# Patient Record
Sex: Male | Born: 1960 | Race: White | Hispanic: No | Marital: Single | Smoking: Current every day smoker
Health system: Southern US, Community
[De-identification: ages and names within clinical notes are randomized; demographics above are authoritative.]

## PROBLEM LIST (undated history)

## (undated) DIAGNOSIS — T79A12A Traumatic compartment syndrome of left upper extremity, initial encounter: Secondary | ICD-10-CM

## (undated) DIAGNOSIS — T79A22A Traumatic compartment syndrome of left lower extremity, initial encounter: Secondary | ICD-10-CM

## (undated) DIAGNOSIS — I729 Aneurysm of unspecified site: Secondary | ICD-10-CM

## (undated) DIAGNOSIS — N289 Disorder of kidney and ureter, unspecified: Secondary | ICD-10-CM

## (undated) HISTORY — PX: CEREBRAL ANEURYSM REPAIR: SHX164

## (undated) HISTORY — PX: OTHER SURGICAL HISTORY: SHX169

---

## 1998-04-23 ENCOUNTER — Encounter: Payer: Self-pay | Admitting: Emergency Medicine

## 1998-04-23 ENCOUNTER — Observation Stay (HOSPITAL_COMMUNITY): Admission: EM | Admit: 1998-04-23 | Discharge: 1998-04-24 | Payer: Self-pay | Admitting: Emergency Medicine

## 2004-07-08 ENCOUNTER — Ambulatory Visit: Payer: Self-pay

## 2004-08-13 ENCOUNTER — Ambulatory Visit: Payer: Self-pay

## 2006-05-14 ENCOUNTER — Ambulatory Visit: Payer: Self-pay | Admitting: Specialist

## 2006-08-31 ENCOUNTER — Ambulatory Visit: Payer: Self-pay | Admitting: Specialist

## 2007-09-29 ENCOUNTER — Inpatient Hospital Stay: Payer: Self-pay | Admitting: Psychiatry

## 2008-06-13 ENCOUNTER — Inpatient Hospital Stay: Payer: Self-pay | Admitting: Psychiatry

## 2008-07-27 ENCOUNTER — Emergency Department: Payer: Self-pay | Admitting: Emergency Medicine

## 2009-12-11 ENCOUNTER — Emergency Department: Payer: Self-pay | Admitting: Emergency Medicine

## 2010-01-15 ENCOUNTER — Inpatient Hospital Stay: Payer: Self-pay | Admitting: Psychiatry

## 2011-03-21 ENCOUNTER — Emergency Department: Payer: Self-pay | Admitting: Emergency Medicine

## 2011-08-03 ENCOUNTER — Inpatient Hospital Stay: Payer: Self-pay | Admitting: Internal Medicine

## 2011-08-03 DIAGNOSIS — R748 Abnormal levels of other serum enzymes: Secondary | ICD-10-CM

## 2011-08-03 DIAGNOSIS — Z79899 Other long term (current) drug therapy: Secondary | ICD-10-CM

## 2011-08-03 LAB — CBC
HCT: 53 % — ABNORMAL HIGH (ref 40.0–52.0)
HGB: 17.6 g/dL (ref 13.0–18.0)
MCH: 31.7 pg (ref 26.0–34.0)
MCHC: 33.2 g/dL (ref 32.0–36.0)
MCV: 95 fL (ref 80–100)
Platelet: 149 10*3/uL — ABNORMAL LOW (ref 150–440)
RBC: 5.55 10*6/uL (ref 4.40–5.90)
RDW: 13.7 % (ref 11.5–14.5)
WBC: 9.5 10*3/uL (ref 3.8–10.6)

## 2011-08-03 LAB — DRUG SCREEN, URINE
Amphetamines, Ur Screen: NEGATIVE (ref ?–1000)
Barbiturates, Ur Screen: NEGATIVE (ref ?–200)
Cannabinoid 50 Ng, Ur ~~LOC~~: NEGATIVE (ref ?–50)
Cocaine Metabolite,Ur ~~LOC~~: POSITIVE (ref ?–300)
MDMA (Ecstasy)Ur Screen: NEGATIVE (ref ?–500)
Methadone, Ur Screen: POSITIVE (ref ?–300)
Opiate, Ur Screen: POSITIVE (ref ?–300)
Phencyclidine (PCP) Ur S: NEGATIVE (ref ?–25)
Tricyclic, Ur Screen: NEGATIVE (ref ?–1000)

## 2011-08-03 LAB — COMPREHENSIVE METABOLIC PANEL
Albumin: 4.2 g/dL (ref 3.4–5.0)
Alkaline Phosphatase: 63 U/L (ref 50–136)
Anion Gap: 10 (ref 7–16)
BUN: 22 mg/dL — ABNORMAL HIGH (ref 7–18)
Bilirubin,Total: 2.5 mg/dL — ABNORMAL HIGH (ref 0.2–1.0)
Calcium, Total: 8.9 mg/dL (ref 8.5–10.1)
Chloride: 104 mmol/L (ref 98–107)
Co2: 27 mmol/L (ref 21–32)
Creatinine: 1.22 mg/dL (ref 0.60–1.30)
EGFR (African American): >60
EGFR (Non-African Amer.): >60
Glucose: 78 mg/dL (ref 65–99)
Osmolality: 283 (ref 275–301)
Potassium: 4.1 mmol/L (ref 3.5–5.1)
SGOT(AST): 410 U/L — ABNORMAL HIGH (ref 15–37)
SGPT (ALT): 240 U/L — ABNORMAL HIGH
Sodium: 141 mmol/L (ref 136–145)
Total Protein: 7.5 g/dL (ref 6.4–8.2)

## 2011-08-03 LAB — CK TOTAL AND CKMB (NOT AT ARMC)
CK, Total: 10349 U/L — ABNORMAL HIGH (ref 35–232)
CK, Total: 6358 U/L — ABNORMAL HIGH (ref 35–232)
CK-MB: 40.8 ng/mL — ABNORMAL HIGH (ref 0.5–3.6)

## 2011-08-03 LAB — ETHANOL
Ethanol %: <0.003 % (ref 0.000–0.080)
Ethanol: <3 mg/dL

## 2011-08-03 LAB — BILIRUBIN, DIRECT: Bilirubin, Direct: 0.6 mg/dL — ABNORMAL HIGH (ref 0.00–0.20)

## 2011-08-03 LAB — CK: CK, Total: 15319 U/L — ABNORMAL HIGH (ref 35–232)

## 2011-08-03 LAB — SALICYLATE LEVEL: Salicylates, Serum: <1.7 mg/dL

## 2011-08-03 LAB — TROPONIN I
Troponin-I: 0.07 ng/mL — ABNORMAL HIGH
Troponin-I: 0.06 ng/mL — ABNORMAL HIGH

## 2011-08-03 LAB — AMMONIA: Ammonia, Plasma: 48 mcmol/L — ABNORMAL HIGH (ref 11–32)

## 2011-08-03 LAB — ACETAMINOPHEN LEVEL: Acetaminophen: <2 ug/mL

## 2011-08-03 LAB — FOLATE: Folic Acid: 18.1 ng/mL (ref 3.1–100.0)

## 2011-08-04 DIAGNOSIS — R55 Syncope and collapse: Secondary | ICD-10-CM

## 2011-08-04 LAB — CBC WITH DIFFERENTIAL/PLATELET
Basophil %: 0.9 %
Eosinophil #: 0.4 10*3/uL (ref 0.0–0.7)
Eosinophil %: 6.2 %
HCT: 47.8 % (ref 40.0–52.0)
HGB: 15.8 g/dL (ref 13.0–18.0)
Lymphocyte #: 1 10*3/uL (ref 1.0–3.6)
Lymphocyte %: 16.5 %
MCH: 31.5 pg (ref 26.0–34.0)
Monocyte #: 0.7 x10 3/mm (ref 0.2–1.0)
Monocyte %: 10.6 %
Neutrophil #: 4.2 10*3/uL (ref 1.4–6.5)
Neutrophil %: 65.8 %
Platelet: 128 10*3/uL — ABNORMAL LOW (ref 150–440)
RBC: 5 10*6/uL (ref 4.40–5.90)
RDW: 13.9 % (ref 11.5–14.5)
WBC: 6.3 10*3/uL (ref 3.8–10.6)

## 2011-08-04 LAB — COMPREHENSIVE METABOLIC PANEL
Albumin: 2.8 g/dL — ABNORMAL LOW (ref 3.4–5.0)
Alkaline Phosphatase: 50 U/L (ref 50–136)
Anion Gap: 7 (ref 7–16)
BUN: 14 mg/dL (ref 7–18)
Calcium, Total: 7.4 mg/dL — ABNORMAL LOW (ref 8.5–10.1)
Chloride: 110 mmol/L — ABNORMAL HIGH (ref 98–107)
Creatinine: 0.89 mg/dL (ref 0.60–1.30)
EGFR (Non-African Amer.): >60
Osmolality: 286 (ref 275–301)
Potassium: 4.1 mmol/L (ref 3.5–5.1)
SGOT(AST): 196 U/L — ABNORMAL HIGH (ref 15–37)
SGPT (ALT): 184 U/L — ABNORMAL HIGH
Sodium: 143 mmol/L (ref 136–145)
Total Protein: 5.6 g/dL — ABNORMAL LOW (ref 6.4–8.2)

## 2011-08-04 LAB — ACETAMINOPHEN LEVEL: Acetaminophen: <2 ug/mL

## 2012-04-02 ENCOUNTER — Emergency Department: Payer: Self-pay | Admitting: Emergency Medicine

## 2012-04-02 LAB — MONONUCLEOSIS SCREEN: Mono Test: NEGATIVE

## 2013-03-29 ENCOUNTER — Ambulatory Visit: Payer: Self-pay | Admitting: Internal Medicine

## 2013-05-08 ENCOUNTER — Ambulatory Visit: Payer: Self-pay | Admitting: Internal Medicine

## 2013-12-08 ENCOUNTER — Emergency Department: Payer: Self-pay | Admitting: Emergency Medicine

## 2013-12-08 LAB — CBC
HCT: 45 % (ref 40.0–52.0)
HGB: 13.9 g/dL (ref 13.0–18.0)
MCH: 24.8 pg — ABNORMAL LOW (ref 26.0–34.0)
MCHC: 30.8 g/dL — ABNORMAL LOW (ref 32.0–36.0)
MCV: 81 fL (ref 80–100)
Platelet: 275 10*3/uL (ref 150–440)
RBC: 5.58 10*6/uL (ref 4.40–5.90)
RDW: 20.3 % — AB (ref 11.5–14.5)
WBC: 7.7 10*3/uL (ref 3.8–10.6)

## 2013-12-08 LAB — COMPREHENSIVE METABOLIC PANEL
ALBUMIN: 3.7 g/dL (ref 3.4–5.0)
ALK PHOS: 50 U/L
AST: 120 U/L — AB (ref 15–37)
Anion Gap: 8 (ref 7–16)
BUN: 15 mg/dL (ref 7–18)
Bilirubin,Total: 0.6 mg/dL (ref 0.2–1.0)
Calcium, Total: 8.5 mg/dL (ref 8.5–10.1)
Chloride: 105 mmol/L (ref 98–107)
Co2: 28 mmol/L (ref 21–32)
Creatinine: 1.28 mg/dL (ref 0.60–1.30)
EGFR (African American): >60
EGFR (Non-African Amer.): >60
Glucose: 90 mg/dL (ref 65–99)
OSMOLALITY: 282 (ref 275–301)
Potassium: 3.9 mmol/L (ref 3.5–5.1)
SGPT (ALT): 93 U/L — ABNORMAL HIGH
Sodium: 141 mmol/L (ref 136–145)
TOTAL PROTEIN: 7.2 g/dL (ref 6.4–8.2)

## 2013-12-08 LAB — SALICYLATE LEVEL: Salicylates, Serum: 2.2 mg/dL

## 2013-12-08 LAB — ACETAMINOPHEN LEVEL

## 2013-12-08 LAB — ETHANOL: Ethanol: <3 mg/dL

## 2013-12-16 ENCOUNTER — Emergency Department: Payer: Self-pay | Admitting: Student

## 2013-12-16 LAB — COMPREHENSIVE METABOLIC PANEL
ALBUMIN: 4.2 g/dL (ref 3.4–5.0)
ALK PHOS: 62 U/L
Anion Gap: 7 (ref 7–16)
BUN: 17 mg/dL (ref 7–18)
Bilirubin,Total: 0.6 mg/dL (ref 0.2–1.0)
CALCIUM: 8.8 mg/dL (ref 8.5–10.1)
CHLORIDE: 102 mmol/L (ref 98–107)
CO2: 27 mmol/L (ref 21–32)
Creatinine: 1.53 mg/dL — ABNORMAL HIGH (ref 0.60–1.30)
EGFR (Non-African Amer.): 51 — ABNORMAL LOW
GFR CALC AF AMER: 59 — AB
GLUCOSE: 114 mg/dL — AB (ref 65–99)
Osmolality: 274 (ref 275–301)
POTASSIUM: 3.6 mmol/L (ref 3.5–5.1)
SGOT(AST): 102 U/L — ABNORMAL HIGH (ref 15–37)
SGPT (ALT): 125 U/L — ABNORMAL HIGH
Sodium: 136 mmol/L (ref 136–145)
Total Protein: 7.6 g/dL (ref 6.4–8.2)

## 2013-12-16 LAB — CBC
HCT: 48.2 % (ref 40.0–52.0)
HGB: 15.2 g/dL (ref 13.0–18.0)
MCH: 25.3 pg — ABNORMAL LOW (ref 26.0–34.0)
MCHC: 31.5 g/dL — ABNORMAL LOW (ref 32.0–36.0)
MCV: 80 fL (ref 80–100)
Platelet: 283 10*3/uL (ref 150–440)
RBC: 6 10*6/uL — ABNORMAL HIGH (ref 4.40–5.90)
RDW: 20.8 % — ABNORMAL HIGH (ref 11.5–14.5)
WBC: 8 10*3/uL (ref 3.8–10.6)

## 2013-12-16 LAB — SALICYLATE LEVEL: Salicylates, Serum: 3.6 mg/dL — ABNORMAL HIGH

## 2013-12-16 LAB — ETHANOL: Ethanol: <3 mg/dL

## 2013-12-16 LAB — ACETAMINOPHEN LEVEL

## 2013-12-17 LAB — URINALYSIS, COMPLETE
Bacteria: NONE SEEN
Bilirubin,UR: NEGATIVE
Blood: NEGATIVE
Glucose,UR: NEGATIVE mg/dL (ref 0–75)
LEUKOCYTE ESTERASE: NEGATIVE
Nitrite: NEGATIVE
Ph: 6 (ref 4.5–8.0)
Protein: NEGATIVE
SPECIFIC GRAVITY: 1.02 (ref 1.003–1.030)
Squamous Epithelial: NONE SEEN
WBC UR: 2 /HPF (ref 0–5)

## 2013-12-17 LAB — DRUG SCREEN, URINE
Amphetamines, Ur Screen: NEGATIVE (ref ?–1000)
BARBITURATES, UR SCREEN: NEGATIVE (ref ?–200)
BENZODIAZEPINE, UR SCRN: POSITIVE (ref ?–200)
CANNABINOID 50 NG, UR ~~LOC~~: NEGATIVE (ref ?–50)
COCAINE METABOLITE, UR ~~LOC~~: POSITIVE (ref ?–300)
MDMA (Ecstasy)Ur Screen: NEGATIVE (ref ?–500)
Methadone, Ur Screen: POSITIVE (ref ?–300)
Opiate, Ur Screen: NEGATIVE (ref ?–300)
Phencyclidine (PCP) Ur S: NEGATIVE (ref ?–25)
Tricyclic, Ur Screen: NEGATIVE (ref ?–1000)

## 2014-03-27 ENCOUNTER — Emergency Department: Payer: Self-pay | Admitting: Emergency Medicine

## 2014-03-27 LAB — CBC
HCT: 47 % (ref 40.0–52.0)
HGB: 14.7 g/dL (ref 13.0–18.0)
MCH: 27.2 pg (ref 26.0–34.0)
MCHC: 31.3 g/dL — ABNORMAL LOW (ref 32.0–36.0)
MCV: 87 fL (ref 80–100)
Platelet: 258 10*3/uL (ref 150–440)
RBC: 5.43 10*6/uL (ref 4.40–5.90)
RDW: 18.6 % — ABNORMAL HIGH (ref 11.5–14.5)
WBC: 16.8 10*3/uL — ABNORMAL HIGH (ref 3.8–10.6)

## 2014-03-27 LAB — URINALYSIS, COMPLETE
BILIRUBIN, UR: NEGATIVE
Bacteria: NONE SEEN
Glucose,UR: >=500 mg/dL (ref 0–75)
Hyaline Cast: 2
Leukocyte Esterase: NEGATIVE
Nitrite: NEGATIVE
PH: 5 (ref 4.5–8.0)
SPECIFIC GRAVITY: 1.023 (ref 1.003–1.030)
Squamous Epithelial: NONE SEEN

## 2014-03-27 LAB — ETHANOL

## 2014-03-27 LAB — ACETAMINOPHEN LEVEL: Acetaminophen: <2 ug/mL

## 2014-03-27 LAB — COMPREHENSIVE METABOLIC PANEL
ALBUMIN: 3.5 g/dL (ref 3.4–5.0)
Alkaline Phosphatase: 63 U/L
Anion Gap: 9 (ref 7–16)
BUN: 24 mg/dL — ABNORMAL HIGH (ref 7–18)
Bilirubin,Total: 1.5 mg/dL — ABNORMAL HIGH (ref 0.2–1.0)
CHLORIDE: 102 mmol/L (ref 98–107)
CO2: 27 mmol/L (ref 21–32)
Calcium, Total: 8.9 mg/dL (ref 8.5–10.1)
Creatinine: 1.11 mg/dL (ref 0.60–1.30)
EGFR (Non-African Amer.): >60
GLUCOSE: 127 mg/dL — AB (ref 65–99)
Osmolality: 281 (ref 275–301)
Potassium: 4.1 mmol/L (ref 3.5–5.1)
SGOT(AST): 745 U/L — ABNORMAL HIGH (ref 15–37)
SGPT (ALT): 323 U/L — ABNORMAL HIGH
Sodium: 138 mmol/L (ref 136–145)
TOTAL PROTEIN: 7.2 g/dL (ref 6.4–8.2)

## 2014-03-27 LAB — SALICYLATE LEVEL

## 2014-03-27 LAB — DRUG SCREEN, URINE
AMPHETAMINES, UR SCREEN: NEGATIVE (ref ?–1000)
Barbiturates, Ur Screen: NEGATIVE (ref ?–200)
Benzodiazepine, Ur Scrn: POSITIVE (ref ?–200)
COCAINE METABOLITE, UR ~~LOC~~: POSITIVE (ref ?–300)
Cannabinoid 50 Ng, Ur ~~LOC~~: NEGATIVE (ref ?–50)
MDMA (Ecstasy)Ur Screen: POSITIVE (ref ?–500)
Methadone, Ur Screen: NEGATIVE (ref ?–300)
Opiate, Ur Screen: NEGATIVE (ref ?–300)
PHENCYCLIDINE (PCP) UR S: NEGATIVE (ref ?–25)
Tricyclic, Ur Screen: NEGATIVE (ref ?–1000)

## 2014-03-28 ENCOUNTER — Emergency Department: Payer: Self-pay | Admitting: Emergency Medicine

## 2014-03-28 LAB — CBC
HCT: 46.8 % (ref 40.0–52.0)
HGB: 15 g/dL (ref 13.0–18.0)
MCH: 27.7 pg (ref 26.0–34.0)
MCHC: 32 g/dL (ref 32.0–36.0)
MCV: 87 fL (ref 80–100)
Platelet: 251 10*3/uL (ref 150–440)
RBC: 5.41 10*6/uL (ref 4.40–5.90)
RDW: 18.8 % — AB (ref 11.5–14.5)
WBC: 12.3 10*3/uL — ABNORMAL HIGH (ref 3.8–10.6)

## 2014-03-28 LAB — BASIC METABOLIC PANEL
Anion Gap: 8 (ref 7–16)
BUN: 16 mg/dL (ref 7–18)
CALCIUM: 8.2 mg/dL — AB (ref 8.5–10.1)
CHLORIDE: 104 mmol/L (ref 98–107)
CO2: 30 mmol/L (ref 21–32)
Creatinine: 0.94 mg/dL (ref 0.60–1.30)
EGFR (African American): >60
EGFR (Non-African Amer.): >60
Glucose: 102 mg/dL — ABNORMAL HIGH (ref 65–99)
Osmolality: 285 (ref 275–301)
Potassium: 3.6 mmol/L (ref 3.5–5.1)
SODIUM: 142 mmol/L (ref 136–145)

## 2014-03-28 LAB — TROPONIN I: Troponin-I: <0.02 ng/mL

## 2014-08-03 NOTE — Consult Note (Signed)
PATIENT NAME:  Benjamin Merritt, Benjamin Merritt MR#:  [id number] DATE OF BIRTH:  [birth year]  DATE OF CONSULTATION:  12/17/2013  REFERRING PHYSICIAN:   CONSULTING PHYSICIAN:  Audery AmelJohn T. Damari Suastegui, MD  HISTORY OF PRESENT ILLNESS: A 54 year old man presented last night intoxicated on cocaine, was committed for making suicidal statements in front of his wife. See full intake. Today, he has slept during the night, says he has calmed down and his mood is feeling better. Denies any suicidal ideation. The patient appears to currently be lucid and is requesting discharge from the Emergency Room and says he will follow up with cocaine dependence treatment in the community.   REVIEW OF SYSTEMS: Denies any acute pain, nausea, shortness of breath, or other physical symptoms. Denies suicidal or homicidal ideation. Denies hallucinations.  Full review of systems negative.   MENTAL STATUS EXAMINATION: Neatly groomed man, looks his stated age, cooperative with the interview. Eye contact normal. Psychomotor activity normal. Speech normal. Affect still somewhat blunted. Mood stated as being all right. Thoughts are lucid. No loosening of associations or delusions. Denies auditory or visual hallucinations. Denies suicidal or homicidal ideation. Shows adequate insight and judgment. Normal intelligence. Alert and oriented x4.  Short and long-term memory intact.   ASSESSMENT:  A 54 year old man with cocaine dependence and stimulant-induced mood disorder, now resolved. No evidence of acute suicidality. Not likely to benefit from inpatient hospital treatment. Not medically unstable.  No longer meets commitment criteria.   TREATMENT PLAN: Discussed with Dr. Dondra SpryGail the Emergency Room physician. The patient off of IVC as he no longer meets criteria. The patient strongly counseled about the dangers of cocaine use. He agrees that he will follow up with his outpatient substance abuse treatment the [facility] in [place]. The patient can be released  at this point.   DIAGNOSIS, PRINCIPAL AND PRIMARY:  AXIS I: Substance-induced mood disorder, manic-like symptoms from stimulants.   SECONDARY DIAGNOSIS:  AXIS I: Cocaine abuse opiate abuse on maintenance medication.     ____________________________ Audery AmelJohn T. Maddalynn Barnard, MD jtc:by D: 12/17/2013 13:48:52 ET T: 12/17/2013 15:40:40 ET JOB#: [id number]  cc: Audery AmelJohn T. Dilia Alemany, MD, <Dictator> Audery AmelJOHN T Carmie Lanpher MD ELECTRONICALLY SIGNED 12/21/2013 17:01

## 2014-08-03 NOTE — Consult Note (Signed)
Brief Consult Note: Diagnosis: cocaine abuse.   Patient was seen by consultant.   Consult note dictated.   Orders entered.   Comments: Psychiatry: PAtient seen and chart reviewed. Long hx of drug abuse. Currently irritable. Denies SI. Does not need inpatient treatment but would prob be safer after resting a few hours longer. Reevaluate in am.  Electronic Signatures: Audery Amellapacs, Ahmia Colford T (MD)  (Signed 06-Sep-15 17:19)  Authored: Brief Consult Note   Last Updated: 06-Sep-15 17:19 by Audery Amellapacs, Michele Kerlin T (MD)

## 2014-08-03 NOTE — Consult Note (Signed)
Brief Consult Note: Diagnosis: cocaine abuse.   Patient was seen by consultant.   Consult note dictated.   Discussed with Attending MD.   Comments: Psychiatry: PAtient currently calm and denies any suicidal or homicidal ideation. He is lucid. Agrees he needs to stop cocaine. No longer commitable. RElease from IVC. Follow up [facility] program in [place].  Electronic Signatures: Toni Amendlapacs, Jackquline DenmarkJohn T (MD)  (Signed 07-Sep-15 13:45)  Authored: Brief Consult Note   Last Updated: 07-Sep-15 13:45 by Audery Amellapacs, John T (MD)

## 2014-08-03 NOTE — Consult Note (Signed)
PATIENT NAME:  Benjamin Merritt, Benjamin Merritt MR#:  [id number] DATE OF BIRTH:  Nov 03, 1960  DATE OF CONSULTATION:  12/16/2013  CONSULTING PHYSICIAN:  Benjamin Amel, MD  IDENTIFYING INFORMATION AND REASON FOR CONSULTATION: A 54 year old man with an established substance dependence problem was brought in under involuntary commitment filled out by his wife.   CHIEF COMPLAINT: "She thinks she knows everything, but she don't."   HISTORY OF PRESENT ILLNESS: The Commitment Petition states that he has been abusing cocaine heavily and has been getting more angry and frustrated and that he made statements about how he would be better off dead, which appeared to be possibly indicating suicidal ideation. On interview today, the patient admits that he made a statement like that, but he denies that he had any suicidal intent. He says he was angry at his wife because of how she nags him constantly about his drug addiction problem. He admits that he has been abusing crack cocaine for several days now. He denies that he has been abusing other drugs, only that he has been taking his usually prescribed narcotics. He states that his mood has been bad and angry. Sleep is poor. Appetite poor. Denies having any actual suicidal intent. No homicidal ideation. Denies that he is having any hallucinations. The patient states very clearly that he thinks that his wife is over stepping her bounds and that he thinks she has no right to insist that he stop abusing drugs.   PAST PSYCHIATRIC HISTORY: The patient has a long history of drug abuse. He has been pulled over by police, been to rehab programs. Never has it seemed like it has stuck for a really long period of time. Today, he is clearly minimizing his use to me, saying that he only uses for about 4 days every few months or so. He was here in the emergency room just a couple of months ago with a similar episode. In any case, the patient does not have a history of suicide attempts. Says he  has never been really treated for any kind of mental health problem, except the substance abuse.   FAMILY HISTORY: The patient will not discuss this.   SOCIAL HISTORY: Lives with his wife. No children. The patient says he is not currently working because he has an inheritance that he lives on. He seems to be a little bit evasive about this.   PAST MEDICAL HISTORY: He claims he has no significant medical problems other than chronic pain. He takes standing doses of oxycodone, prescribed by his primary care doctor. He says that it from a shoulder injury he suffered while he was in the [employer]. He did have an episode in 2013 of rhabdomyolysis related to his substance abuse.   CURRENT MEDICATIONS: Oxycodone 30 mg 4 times a day, also takes testosterone supplements prescribed by his primary care doctor.   ALLERGIES: No known drug allergies.   REVIEW OF SYSTEMS: Feels angry and irritable. Chronic pain. Feels like he needs to have a cigarette. Otherwise, no specific symptoms. The rest of the full review of systems is negative.   MENTAL STATUS EXAMINATION: Neatly groomed man, looks his stated age. He is cooperative with the interview, although rather angry and irritable about it. Eye contact poor. Psychomotor activity fidgety. Speech rapid and loud at times, but not necessarily pressured. Thoughts at times intensely focused on his anger at his wife, but does not make obviously delusional statements. Denies auditory or visual hallucinations. Denies suicidal or homicidal ideation. He is alert  and oriented x 4. Normal intelligence. Short and long-term memory intact. Judgment and insight questionable, especially about his substance abuse.   LABORATORY RESULTS: His chemistry panel shows an elevated glucose at 114, creatinine elevated at 1.53, ALT elevated at 125, AST elevated at 102. Alcohol level negative. CBC largely unremarkable. He has not given a urinalysis sample. We do not have a drug screen yet.   Benjamin Merritt  SIGNS: He has current blood pressure of 156/94, respirations 18, pulse 86, temperature 98.4.   ASSESSMENT: This is a 54 year old man with a history of substance abuse, brought in under an involuntary commitment. The patient is denying any suicidality. He does not have a history of suicide attempts. He does have a history of irresponsible substance abuse behavior. His current presentation suggests to me that he is still intoxicated on cocaine and possibly other drugs. I do not think that he needs hospitalization. He does not appear to be abusing alcohol or having any acute medical symptoms. On the other hand, I think he would be safer if allowed to rest up for a little while and detox a bit before being discharged. I recommend that he be fed and I have given him a nicotine patch and supplement. He can rest up overnight. I will re-evaluate in the morning.   DIAGNOSIS: Principal and primary:  AXIS I: Cocaine abuse.   SECONDARY DIAGNOSES: AXIS I: Substance-induced mood disorder, agitated and manic-like symptoms.  AXIS II: Deferred.  AXIS III: Chronic pain.    ____________________________ Benjamin AmelJohn T. Clapacs, MD jtc:TT D: 12/16/2013 17:30:55 ET T: 12/16/2013 18:05:11 ET JOB#: [id number]  cc: Benjamin AmelJohn T. Clapacs, MD, <Dictator> Benjamin AmelJOHN T CLAPACS MD ELECTRONICALLY SIGNED 12/21/2013 17:01

## 2014-08-03 NOTE — Consult Note (Signed)
PATIENT NAME:  Benjamin Merritt, Benjamin Merritt MR#:  [id number] DATE OF BIRTH:  [birth year]  DATE OF CONSULTATION:  03/28/2014  REFERRING PHYSICIAN:   CONSULTING PHYSICIAN:  Audery Amel, MD  IDENTIFYING INFORMATION AND REASON FOR CONSULTATION: This is a 54 year old man with a history of substance abuse who was petitioned to the hospital by his wife who is expressing concern about his substance use, the patient's chief concern  "they got me up here again."   HISTORY OF PRESENT ILLNESS: Information obtained from the patient and the chart. The patient admitted that he had been using crack cocaine over the last day. He was a little vague about it, but seemed to be claiming that prior to yesterday's use he had been sober for at least several days. He denies that he is using any illicit narcotics or drinking regularly. He says that his mood recently has been not so good. Sleep is not so good. He has a chronic pain issue which he says is still feeling out of control. He denies any suicidal ideation or homicidal ideation or hallucinations. He just got out of a long-term rehab program in [place] fairly recently and more or less immediately relapsed into drug use intermittently. Has not been following up with any local treatment. He has been taking his prescribed medication.   PAST PSYCHIATRIC HISTORY: The patient has a history of cocaine abuse as well as opiate abuse, chronic pain, and depression. There is no clear history of prior suicide attempts. He has been in several substance treatment programs over the last couple of years and continues to relapse. He is currently being treated with Wellbutrin, Vistaril, and Neurontin for his mood and anxiety. Says he has been compliant with those but ran out of his Neurontin recently. No history of psychotic symptoms.   FAMILY HISTORY: Denies any family history.   SOCIAL HISTORY: Separated from his wife. Currently he is staying in a motel and has paid up for a month. After  the beginning of year he is planning to get his own apartment. The patient recognizes that he has limited social support now. No transportation or way to get around.   PAST MEDICAL HISTORY: Chronic pain related to an injury he suffered in the [employer]. Uses a fentanyl patch.   CURRENT MEDICATIONS: Fentanyl patch 25 mcg, Neurontin 1200 mg 3 times a day, Vistaril 100 mg at night, Wellbutrin 100 mg in the morning.   ALLERGIES: No known drug allergies.   REVIEW OF SYSTEMS: Chronic pain in his right chest reaching around to his back. Mildly depressed mood. Denies suicidal ideation. Denies homicidal ideation. Denies any hallucinations. Not feeling jittery or having any other specific physical complaints.   MENTAL STATUS EXAMINATION: Disheveled gentleman with several recent scrapes and cuts on him which he cannot explain. Initially resistant to the interview and irritable, but calmed down and was able to participate appropriately. Eye contact only intermittent. Psychomotor activity fidgety. Speech is quite variable. On a couple of occasions he got angry and started shouting, but then would calm down again and speak normally, had a normal tone. Affect is labile and irritable. No sign of psychosis however, no threatening behavior, no evidence of delusions. Denies auditory or visual hallucinations. Denies suicidal or homicidal ideation. Can repeat 3 out of 3 words immediately, remembers 2 out of 3 at 3 minutes. He is alert and oriented x 4. Judgment and insight adequate enough, he understands the consequences of his continued behavior and he understands the availability of outpatient treatment.  LABORATORY RESULTS: Drug screen positive for MDMA which is probably the Wellbutrin and cocaine, also benzodiazepines. Chemistry, elevated glucose 127, elevated BUN at 24, ALT elevated at 323, AST elevated at 745. Bilirubin elevated at 1.5. CBC, elevated white count at 16.8.   VITAL SIGNS: Blood pressure 117/91,  respirations 20, pulse 85, temperature 98.6.   ASSESSMENT: A 54 year old man with cocaine abuse, severe. Currently denies suicidal or homicidal ideation. No evidence of suicidality or suicidal ideation last night. He is not psychotic. The patient expresses that he thinks he had enough of inpatient substance abuse treatment and he adamantly does not want to go back to it. Initially he is quite irritable about being brought in here, but eventually he was able to acknowledge that he did understand that his cocaine problem was out of hand. He agrees that he will consider going to outpatient treatment especially if he can see a therapist.   TREATMENT PLAN: He does not meet commitment criteria. IVC discontinued. Mr. Unk PintoHarvey Bryant, the representative of [facility]'s community support visited the patient this morning and left his phone number. I told the patient that Mr. Beverely PaceBryant was an extremely helpful person and could provide assistance if the patient was willing to participate in substance abuse treatment including practical assistance in getting him in to see a therapist. I strongly encouraged the patient to call Mr. Raj JanusBryant's phone number. He is encouraged to follow up with [facility] for outpatient treatment. He has his own primary care doctor who prescribes his medicine. Supportive counseling done.   DIAGNOSIS PRINCIPAL AND PRIMARY:   AXIS I: Cocaine abuse, severe.   SECONDARY DIAGNOSES:   AXIS I: Depression not otherwise specified.     AXIS II: Deferred.   AXIS III: Chronic pain.     ____________________________ Audery AmelJohn T. Clapacs, MD jtc:bu D: 03/28/2014 16:56:37 ET T: 03/28/2014 17:30:06 ET JOB#: [id number]  cc: Audery AmelJohn T. Clapacs, MD, <Dictator> Audery AmelJOHN T CLAPACS MD ELECTRONICALLY SIGNED 04/03/2014 0:39

## 2014-08-03 NOTE — Consult Note (Signed)
PATIENT NAME:  Benjamin Merritt, Benjamin Merritt MR#:  [id number] DATE OF BIRTH:  [birth year]  DATE OF CONSULTATION:  12/08/2013  REFERRING PHYSICIAN:   CONSULTING PHYSICIAN:  Kyndra Condron K. Koury Roddy, MD  SUBJECTIVE:  The patient was seen in consultation in room number 22-A, [facility].  The patient is a 54 year old white male who is not employed and is married for 7 years.  The patient was brought to the [facility] Emergency Room on IVC because he has been abusing cocaine on a regular basis and he and his wife had conflicts today stating that they got into arguments and fights and here he comes for help.    CHIEF COMPLAINT:  "I don't need to be here.  I've never been in any mental health institution and I will never go to a mental health institution."  PAST PSYCHIATRIC HISTORY: No previous history of inpatient on psychiatry.  No history of suicide attempt. Not being followed by any psychiatrist.   ALCOHOL AND DRUGS:  According to information obtained, the patient has been abusing cocaine and this has become more regular to the extent this has caused serious issues with his job and his marriage.  His wife got upset and she practically threw him out and the patient stated to the staff here that he has a girlfriend to go with which is rather questionable.  The patient reports he has an appointment at [facility] [place], [place], where they give help for cocaine and that he is going to go there as soon as he is discharged from the Emergency Room here and that is what he needs.    MENTAL STATUS EXAMINATION:  The patient is alert and oriented to place, person, and time, calm, pleasant, frustrated, upset about the conflicts with his wife.  Denies feeling depressed.  Denies feeling hopeless or helpless. No psychosis.  Does not appear to be responding to internal stimuli.  Contracts for safety and is eager to go the [facility] [place], [place].  Insight and judgment fair.  Impulse control is guarded.     IMPRESSION: Cocaine abuse, chronic, recurrent, with marriage conflicts.  RECOMMENDATIONS: Discontinue involuntary commitment and discharge patient.  He reports he has a girlfriend and he reports that he has a place to go and will go to [facility] [place] [facility].    _______________________ Jannet MantisSurya K. Guss Bundehalla, MD skc:nr D: 12/08/2013 16:21:37 ET T: 12/08/2013 17:06:35 ET JOB#: [id number]  cc: Monika SalkSurya K. Guss Bunde[place]alla, MD, <Dictator> Beau FannySURYA K Mackenze Grandison MD ELECTRONICALLY SIGNED 12/09/2013 15:46

## 2014-08-04 NOTE — Discharge Summary (Signed)
PATIENT NAME:  Benjamin Merritt, Benjamin Merritt MR#:  [id number] DATE OF BIRTH:  [birth year]  DATE OF ADMISSION:  08/03/2011 DATE OF DISCHARGE:  08/04/2011  ADMITTING DIAGNOSIS: Altered mental status due to drug abuse.   DISCHARGE DIAGNOSES: 1. Altered mental status, metabolic encephalopathy due to drug abuse. Urine drug screen positive for cocaine, methadone, as well as opiates.  2. Rhabdomyolysis, resolving on IV fluids.  3. Elevated troponin, not acute coronary syndrome per Cardiology.  4. Questionable opiate withdrawal.  5. Elevated transaminases, chronic.  6. Thrombocytopenia, stable.   DISCHARGE CONDITION: Stable.   DISCHARGE MEDICATIONS: The patient is to resume his outpatient medications which are methadone 90 mg p.o. daily.  HOME OXYGEN: None.   DIET: Regular.   PHYSICAL ACTIVITY LIMITATIONS: As tolerated.   FOLLOW-UP APPOINTMENT: Follow-up with Dr. Thana Ates in two days after discharge.   CONSULTANTS:  1. Dr. Jeanie Sewer  2. Dr. Elease Hashimoto    RADIOLOGIC STUDIES: Echocardiogram 08/03/2011 left ventricle normal size. No thrombus. Normal left ventricular wall thickness. Left ventricular ejection fraction is normal. Ventricular wall motion is also normal. Right ventricle grossly normal size. Right ventricular systolic function normal. No mitral regurgitation. No mitral valve stenosis. Left atrium as well as right atrial sizes were normal. Tricuspid valve was not well visualized but grossly normal. No tricuspid regurgitation was noted. Aortic valve was normal in structure and function. No aortic regurgitation was present. No hemodynamically significant valvular aortic stenosis was also noted.   OTHER RADIOLOGIC STUDIES: None.   REASON FOR ADMISSION: The patient is a 54 year old Caucasian male with past medical history significant for history of polysubstance abuse, in [facility] in the past, who presented to the hospital when he was found to be unresponsive at the steering wheel in his  car. He was brought to the hospital and was able to answer some questions, however, he was very loud and had foul language. Apparently crack cocaine was also found in the car as well as a cigarette lighter and tube to smoke cocaine was also found in the back pocket according to medical records. Hospitalist services were contacted for admission as the patient was noted to have marked elevated CK levels. He was admitted to the hospital. Please refer to Dr. Arlys John note on admission 08/03/2011.    PHYSICAL EXAMINATION: On arrival to the Emergency Room, the patient's vitals revealed temperature 96.9, pulse 77, respiration rate was 18, blood pressure 107/69, saturation was 95% on room air. Physical exam was unremarkable.   LAB DATA: 08/03/2011 showed mild elevation of BUN to 22. Liver enzymes showed total bilirubin of 2.5, direct bilirubin 0.6, AST 410, ALT 240. CK total 15,319. CBC white blood cell count 9.5, hemoglobin 17.6, platelet count 149. Coagulation panel was within normal limits. Acetaminophen level was less than 2. Salicylate level was less than 1.7.   HOSPITAL COURSE: The patient was admitted to the hospital. Consultation with cardiologist because of elevated troponin level to 0.07, later in the day on 08/03/2011 accompanied by elevation of CPK-MB as well as CK total was noted. Cardiology saw the patient in consultation the same day and felt that the patient's troponin elevation is miniscule and did not seem to be related to any EKG abnormalities. He felt that there was no evidence of acute coronary syndrome. He felt that the patient's troponin elevation probably was in the setting of profound rhabdomyolysis. He recommended no further testing and he did not suggest any stress testing. He felt that echocardiogram could be interesting for prognostic purposes but doubted it  would change the course of the patient's self-destructive behavior. Echocardiogram was unremarkable and the patient was felt to be  safe in regards to this.   Consultation with Psychiatry, Dr. Jeanie SewerWilliford, was also obtained because of the patient's abuse problems and the patient being somewhat irritable, loud, and not cooperative. He felt, however, that the patient had no data to support that he had any withdrawal delirium and felt that the patient would not benefit from admission to Behavioral Unit for further evaluation or opiate detoxification, however, he also felt that the patient is at risk for withdrawal of some nature. However, the patient declined admission to Behavioral Unit and that is why he was not committable. He stated that he belongs to [facility] and did not want to lose a slot in the clinic. For this reason, it was felt that the patient is safe to be discharged and he is being discharged in stable condition with the above-mentioned medications and follow-up.   VITAL SIGNS ON THE DAY OF DISCHARGE: Temperature 98.6, pulse 67, respiration rate 18, blood pressure 118/76, saturation was 94% on 2 liters of oxygen through nasal cannula.   TIME SPENT: 40 minutes.   ____________________________ Katharina Caperima Sereen Schaff, MD rv:drc D: 08/04/2011 16:06:43 ET T: 08/05/2011 10:57:57 ET JOB#: [id number]  cc: Katharina Caperima Ramere Downs, MD, <Dictator> Dennison MascotLemont Morrisey, MD Reeda Soohoo MD ELECTRONICALLY SIGNED 08/05/2011 20:19

## 2014-08-04 NOTE — Consult Note (Signed)
Pt demonstrating that he does not have delerium. Please see stat consult and order addition.  Electronic Signatures: Jonatha Gagen, Adelene AmasJames S (MD)  (Signed on 23-Apr-13 17:00)  Authored  Last Updated: 23-Apr-13 17:00 by Lester CarolinaWilliford, Emmy Keng S (MD)

## 2014-08-04 NOTE — H&P (Signed)
PATIENT NAME:  Benjamin Merritt, Benjamin Merritt MR#:  [id number] DATE OF BIRTH:  1962-08-13Mickie Merr[birth year]  DATE OF ADMISSION:  08/03/2011  PRIMARY CARE PHYSICIAN: Dennison MascotLemont Morrisey, MD  HISTORY OF PRESENT ILLNESS: The patient is a 54 year old Caucasian male with past medical history significant for history of polysubstance abuse and cocaine addiction as well as opiate dependence in the past, history of adjustment disorder, and previous admissions in the psych unit for detoxification from opiates, as well as other multiple drugs who presented to the hospital via EMS after he was found to be unresponsive and slumped over his steering wheel. Apparently [place] [employer] found the patient in the car slumped over the steering wheel. He became somewhat agitated and had to be restrained at one point. On arrival to the emergency room, he was able to answer some questions; however, the patient's answers were somewhat loud as well as had some foul language. The patient was evaluated by Dr. Lorenso CourierPowers. Crack cocaine was found in the car as well as a cigarette lighter and apparently a tube to snort or smoke cocaine was found in his back pocket besides his wallet, according to medical records. The patient had lab studies done and was found to have elevated troponin and hospitalist services were contacted for admission. The patient himself denies any shortness of breath or chest pain prior to coming to the hospital. He is not able to answer most of my questions however because of him being somewhat somnolent. He received 1 mg of Ativan as well as Lovenox in the hospital and aspirin as well as IV fluids. He was also noted to have rhabdomyolysis with CK levels elevated from 10,000 to 15,000 and, as mentioned above, hospitalist services were contacted for admission.   PAST MEDICAL HISTORY: (According to medical records) 1. Positive for cocaine addiction/dependence.  2. History of opiate dependence. 3. History of adjustment disorder.  4. History of alcohol  abuse in the past. The patient has had several DWIs as well as a few rehabilitation programs in the past.  5. Recently he was admitted to Behavioral Medicine and was discharged in October 2011 for polysubstance detoxication. At that time his drug screen was positive for cocaine as well as opiates as well as benzodiazepines. However, Dr. Mordecai RasmussenJohn Clapacs felt that probably it represented medicine which was given in the emergency room. 6. Elevation of liver tests. 7. History of opiate dependence, as well as partial remission. He was in a treatment program in the past.  8. History of adjustment disorder with mixed disturbance of mood as well as conduct and as mentioned above cocaine dependence.  9. The patient denies any past medical history related to his heart or lungs.  10. Long history of substance abuse starting with major alcohol use and abuse, however, he was not using alcohol over the past 20 years.  11. 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 He was involved in a methadone program initially with very good success, however, later he had problems with affording that. 12. History of cocaine abuse and addiction and crack cocaine.   MEDICATIONS: Unknown. In the past, the patient was on methadone 90 mg p.o. daily. It is unclear how long ago he took those medications and if he is still on this program.   ALLERGIES: No known drug allergies.   SOCIAL HISTORY: Difficult to obtain from him, but in the past the patient was not working. 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 His wife was working.  No children. Second marriage which has lasted approximately 15 years. It is unclear if he still lives with his wife.   REVIEW OF SYSTEMS: Not available. The patient denies any shortness of breath or chest pain at this moment.   PHYSICAL EXAMINATION:   VITALS: On arrival to the hospital, his temperature was 96.9, pulse 77, respiration rate 18, blood pressure 107/69,  and saturation 95% on room air.  GENERAL: This is a well developed, well nourished Caucasian male comfortable lying on the stretcher. He is able to open his eyes briefly and briefly answered questions. However, he has delay in answering the questions. He is very somnolent and closes his eyes again.   EYES: Pupils are equally round and reactive to light. Extraocular movements are intact. No icterus or conjunctivitis. Has normal hearing. No pharyngeal erythema. Mucosa is moist.   NECK: No masses, supple and nontender. Thyroid is not enlarged. No adenopathy, JVD, or carotid bruits bilaterally. Full range of motion.   LUNGS: Clear to auscultation anteriorly. No rales, rhonchi, diminished breath sounds, or wheezing. No labored inspirations, increased effort, dullness to percussion, or respiratory distress.   HEART: S1 and S2 appreciated. No murmurs, rubs, or gallops noted. PMI not lateralized. Chest is nontender to palpation.   EXTREMITIES: 1+ pedal pulses. No lower extremity edema, calf tenderness, or cyanosis was noted.   ABDOMEN: Soft and nontender. Bowel sounds are present. No hepatosplenomegaly or masses were noted.   RECTAL: Deferred.   MUSCULOSKELETAL: I am not able to obtain as the patient is not cooperative with the examination, not able to assess for cyanosis or kyphosis. Gait was not tested.   SKIN: No rashes, lesions, erythema, nodularity, or induration. It was warm and dry to palpation.   LYMPH: No adenopathy in the cervical region.   NEUROLOGICAL: Cranial nerves grossly intact. Not able to assess his sensory. No dysarthria.  No aphasia.   PSYCH: The patient is somnolent, opens his eyes briefly, not cooperative. I am not able to assess his memory. Not confused and no agitation at this moment.   LABS/STUDIES: BMP showed a BUN of 22, otherwise normal study. Liver enzymes revealed total bilirubin of 2.5, AST 410, and ALT 240. The patient's CK total, initial study done at 2 o'clock   a.m. on 08/03/2011, showed level of 15,319. Repeated study at 6:00 a.m. was 10,349. MB fraction was 40.8 and troponin is elevated to 0.07. CBC was within normal limits, except platelet count was 149. Acetaminophen level is less than 2. Salicylate level is less than 1.7.   No radiologic studies were done.  EKG showed normal sinus rhythm at 71 beats per minute, normal axis, nonspecific T wave abnormalities, prolonged QTc to 410 ms.   ASSESSMENT AND PLAN:  1. Encephalopathy, likely metabolic, suspected polysubstance abuse related. Admit the patient to the medical floor. Symptomatic therapy. Get psychiatrist involved for recommendations. Get neuro checks frequently.  2. Elevated transaminases, likely hepatitis. Get hepatitis panel.  3. Mild dehydration, as judged by elevated BUN. Continue the patient on IV fluids.  4. Rhabdomyolysis. Continue the patient on high rate IV fluids.  5. Elevated troponin with nonspecific EKG. Get urine drug screen. Start the patient on metoprolol, aspirin, and nitroglycerin as well as Lovenox, if he is negative for cocaine. We will continue benzodiazepines if he is positive for cocaine, as well as Lovenox and aspirin.  6. Thrombocytopenia, questionably related to liver cirrhosis. Get pro time and INR and follow platelet count in the morning. Get folic acid as  well as B12 levels.   TIME SPENT: One hour. ____________________________ Katharina Caper, MD rv:slb D: 08/03/2011 10:05:01 ET T: 08/03/2011 11:02:33 ET JOB#: [id number]  cc: Katharina Caper, MD, <Dictator> Dennison Mascot, MD Eldredge Veldhuizen MD ELECTRONICALLY SIGNED 08/03/2011 20:07

## 2014-08-04 NOTE — Consult Note (Signed)
Benjamin Merritt is a 54 yo old with a hx of polysubstance abuse who was admitted to [facility] with mental status changes.    Present Illness Pervis is a 54 yo with a longstanding hx of polysubstance abuse.  He has spent time in behavioral health.  He was found slumped over the steering wheel of his car , unconscious.   He was found to have several lab abnormalities including rhabdomyolysis, elevated LFTs, and a minimally elevated Troponin level.    He was barely arousable - answered me only after much prodding and even then in 1 word sentences or a grunt.  He denied any previous CP, dyspnea or other cardiac problems.   Physical Exam:   GEN well developed, well nourished    HEENT hearing intact to voice, moist oral mucosa    NECK supple  No masses    RESP normal resp effort  clear BS    CARD Regular rate and rhythm  No murmur    ABD denies tenderness  no liver/spleen enlargement    LYMPH negative neck    EXTR negative cyanosis/clubbing    SKIN normal to palpation, No rashes    NEURO very solulent, barely arousable    PSYCH lethargic   Review of Systems:   Subjective/Chief Complaint not obtainable due to patient's lethergy and lack of cooperation     Hepatitis C:    Polysubstance abuse:    Depression:    heroin abuse:    Denies surgical history.:        Admit Reason:   Encephalopathy: (348.30) Active, ICD9, Unspecified encephalopathy  Home Medications: Medication Instructions Status  methadone 90 milligram(s) orally once a day Active   Benjamin Ref:  23-Apr-13 02:12    Acetaminophen, Serum < 2  Routine Chem:  23-Apr-13 02:12    Glucose, Serum 78   BUN 22   Creatinine (comp) 1.22   Sodium, Serum 141   Potassium, Serum 4.1   Chloride, Serum 104   CO2, Serum 27   Calcium (Total), Serum 8.9  Hepatic:  23-Apr-13 02:12    Bilirubin, Total 2.5   Alkaline Phosphatase 63   SGPT (ALT) 240   SGOT (AST) 410   Total Protein, Serum 7.5   Albumin, Serum  4.2  Routine Chem:  23-Apr-13 02:12    Osmolality (calc) 283   eGFR (African American) >60   eGFR (Non-African American) >60   Anion Gap 10   Ethanol, S. < 3   Ethanol % (comp) < 0.003  Routine Hem:  23-Apr-13 02:12    WBC (CBC) 9.5   RBC (CBC) 5.55   Hemoglobin (CBC) 17.6   Hematocrit (CBC) 53.0   Platelet Count (CBC) 149   MCV 95   MCH 31.7   MCHC 33.2   RDW 13.7  Benjamin Ref:  25-KYH-06 23:76    Salicylates, Serum < 1.7  Cardiac:  23-Apr-13 02:12    CK, Total 15319  Routine Coag:  23-Apr-13 02:12    Prothrombin 13.5   INR 1.0  Cardiology:  23-Apr-13 03:09    Ventricular Rate 76   Atrial Rate 76   P-R Interval 172   QRS Duration 88   QT 404   QTc 454   P Axis 38   R Axis -15   T Axis 62  Cardiac:  23-Apr-13 06:53    CK, Total 10349   Troponin I 0.07   CPK-MB, Serum 40.8  Cardiology:  23-Apr-13 07:20    Ventricular Rate  71   Atrial Rate 71   P-R Interval 180   QRS Duration 88   QT 470   QTc 510   P Axis 38   R Axis -13   T Axis 57  Hepatic:  23-Apr-13 10:28    Bilirubin, Direct 0.6  Routine Chem:  23-Apr-13 10:28    Ammonia, Plasma 48   Folic Acid, Serum 84.6   Magnesium, Serum 2.0    No Known Allergies:   Vital Signs/Nurse's Notes: **Vital Signs.:   23-Apr-13 11:57   Vital Signs Type Admission   Celsius 36.6   Temperature Source oral   Pulse Pulse 68   Pulse source per Dinamap   Respirations Respirations 20   Systolic BP Systolic BP 962   Diastolic BP (mmHg) Diastolic BP (mmHg) 67   Mean BP 81   BP Source Dinamap   Pulse Ox % Pulse Ox % 94   Pulse Ox Activity Level  At rest   Oxygen Delivery Room Air/ 21 %     Impression 1. Rhabdomyolysys - elevated total CPK and minimal elevation of CK/MB.  The MB index is 0.4% - not consistent with acute coronary injury.  The Troponin elevation is miniscule and is not significant in this setting of profound rhabdo.  The ECG shows NSR with nonspecific ST abnormality.  There is no evidence of  acute coronary syndrome.   I do not think that he needs any further testing - ie I would not suggest a stress test. Th An echo may be interesting for prognostic purposes but I doubt that it will change the course of his present self destructive behavior.    He has a hx of polysubstance abuse including cocaine.  This places him a tremendous risk for many cardiovascular problems in the future- HTN, CHF, MI, cardiac arrest and sudden cardiac death.  No amount of cardiac testing will decrease this risk.  Fortunately, his HR and BP are normal at present.    Plan I would continue IV hydration to minimize his renal damage.  No further cardiac evaluation is indicated.   He may need further neuro eval.  He remains quite lethargic and may need CT of the head.  He will need to observed for withdrawal.    I will try to talk to him tomorrow when he  is less lethargic.  I would be happy to discuss these issues with family.   Electronic Signatures: Anh Bigos, Dreama Saa (MD)  (Signed 23-Apr-13 13:51)  Authored: Benjamin Aspect/Present Illness, History and Physical Exam, Review of System, Past Medical History, Health Issues, Home Medications, Labs, Allergies, Vital Signs/Nurse's Notes, Impression/Plan   Last Updated: 23-Apr-13 13:51 by Colten Desroches, Dreama Saa (MD)

## 2014-08-04 NOTE — Consult Note (Signed)
PATIENT NAME:  Benjamin Merritt, Nishawn W MR#:  [id number] DATE OF BIRTH:  [birth year]  DATE OF CONSULTATION:  08/03/2011  REFERRING PHYSICIAN:  Katharina Caperima Vaickute, MD  CONSULTING PHYSICIAN:  Adelene AmasJames S. Tyvion Edmondson, MD  REASON FOR CONSULTATION: Clouding of consciousness, agitation.   HISTORY OF PRESENT ILLNESS: Mr. Benjamin Merritt is a 54 year old male admitted to the [facility] due to rhabdomyolysis and mental status changes.   The patient was found unresponsive by EMS slumped over his steering wheel. [place] [employer] discovered the patient. They patient did require restraint due to severe agitation. He has often been loud as well as using highly profane language.   He has kept his eyes closed and declined answering questions for the most part in the room. The undersigned asked him, in a supportive fashion, to name some objects. The patient opened his eyes and named three objects. When the undersigned asked him what year it was, he used profanity multiple times and then said "2047". When the undersigned went to ask him what the three objects were five minutes prior, the patient stated using profanity that he knew what the objects were but he was not going to state what they were and he demanded that the undersigned leave his room. His thought process during the whole time has been coherent. Please see the mental status exam.   Regarding his history prior to coming in the hospital, crack cocaine was found in the car as well as a tube for snorting.   It is unclear whether he has been taking his psychotropic medication. He has been in a number of psychiatric programs and substance rehabilitation programs in the past. He was admitted to the [facility] Unit of [facility] in October of 2011. At that time he was on involuntary commitment, was very agitated, yelling, and hostile. He was perceived to be threatening by his wife. She took out IVC papers. At that  time he had been using crack cocaine nearly on a daily basis. He had relapsed after going almost a year clean. He had noted marital arguments.   At that time he was taking methadone through a methadone clinic. He was not using any alcohol.   It was noted in the past psychiatric record that the use of cocaine has been associated with severe agitation and that periods of abstaining are associated with calmer mood. It was also mentioned that diagnoses of mood disorder have been made, however, the patient at that time was not staying on any standing psychotropic medication for mood symptoms because he felt that it was not necessary. He had noted that all major mood symptoms were correlated with substance use.   FAMILY PSYCHIATRIC HISTORY: None known.   SOCIAL HISTORY: Please see the above. Mr. Benjamin Merritt began abusing substances as a young man. He has a long-term history of maintaining sobriety from alcohol. He does not give cooperative history at this time, however, it was mentioned in 2011 that he had abstained from alcohol for over 20 years. He did have an addiction to heroin and also has a diagnosis of Hepatitis C.   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 At that time he was in his second marriage of 15 years without children. A diagnosis was given for cocaine dependence on Axis I. The patient was prescribed Seroquel on discharge 50 mg q.i.d. p.r.n. anxiety. This regimen had shown efficacy for anxiety while in  the hospital. He was continuing on his methadone as part of the methadone clinic. His follow-up appointments at that time included [facility].   PAST MEDICAL HISTORY: 1. Hepatitis C. 2. Acute rhabdomyolysis with high CK. The patient has an initial elevation of his first troponin level.   ALLERGIES: No known drug allergies.   OUTPATIENT MEDICATIONS: Outpatient medications have included methadone 90 mg daily. His MAR is  reviewed. He is on thiamine 100 mg daily. His last dose of Ativan for agitation was given in the Emergency Room. He has not received any other anti-agitation medication over the last six hours.   LABORATORY DATA: CK total 6358. Troponin elevated at 0.06. Urine drug screen positive for methadone, positive for benzodiazepines, positive for opiates, positive for cocaine. Magnesium normal. Folic acid normal. Ammonia mildly elevated at 48. Bilirubin, direct, elevated at 0.6.   His EKG QTc at 07:20 this morning was 510. A repeat EKG now at 16:40 is showing a QTc of 435 ms.   The CK mentioned above has fallen from a previous total of 10,349. Also, the initial troponin level was higher at 0.07. Aspirin negative. CBC unremarkable other than a slightly decreased platelet count at 149. Ethanol negative. The CMP shows an SGOT of 410, SGPT 240, total bilirubin elevated at 2.5, BUN 22, creatinine 1.22. Acetaminophen negative.   REVIEW OF SYSTEMS: The patient is not willing to provide this. The review of systems is gleaned from discussion with the nurses as well as review of the past medical record. Constitutional, HEENT, mouth, neurologic, psychiatric, cardiovascular, respiratory, gastrointestinal, genitourinary, skin, musculoskeletal, hematologic, lymphatic, endocrine, and metabolic are all unremarkable.   PHYSICAL EXAMINATION:   VITAL SIGNS: Temperature 97.5, pulse 73, respiratory rate 20, blood pressure 109/66.   GENERAL APPEARANCE: Benjamin Merritt is a 54 year old male lying in a partially reclined supine position in his hospital bed with no abnormal involuntary movements. He has no cachexia. His grooming is mildly disheveled. Hygiene normal.   He keeps his eyes closed other than a brief darting glance at the undersigned and darting glances at the objects when they are presented to him. He has mild decreased concentration. He clearly is alert. He will not cooperate with orientation questions. Please see the  history of present illness above regarding his answers. The properties such as fund of knowledge, intelligence, use of language, thought content, elaboration of thought process, insight and judgment cannot be tested due to the patient not being willing to provide the answers. He is very clear that he is not willing to cooperate. His affect is agitated. His mood is very irritable. Grossly his thought process is coherent, although he provides poverty of speech. He does state that he knows what the objects were but he does not want to cooperate.   ASSESSMENT:  AXIS I:  1. Mood disorder, not otherwise specified.  2. Polysubstance dependence.  3. Rule out delirium, not otherwise specified. His presenting mental status and behavior could have involved a delirium, however, it is difficult to ascertain given his current presentation.   AXIS II: Deferred.   AXIS III:  1. Rhabdomyolysis. 2. Hepatitis.  3. Please see the past medical history.   AXIS IV: Primary support group, general medical.   AXIS V: 30.   Mr. Petroni is grossly assessed to require further evaluation and treatment. However, he does not clearly present any criteria for commitment at this time. He is not verbalizing any threats towards others. He is using profanity at times. He is yelling loudly at  times, however, he is not violent. Therefore, it is not indicated to petition for involuntary commitment to a psychiatric institution.   However, the undersigned recommends that he be further monitored for any need to provide additional anti-agitation medicine other than Ativan. The undersigned concurs with having the Ativan available 1 to 2 mg p.o. IM or IV q.6 hours p.r.n. agitation.   If the Ativan is not effective or if the patient begins to exhibit any psychosis such as hallucinations or delusions, Abilify can be started at 5 mg p.o. or IM daily with a follow-up EKG to check for QTc prolongation.   Would continue with low  stimulation ego support in an effort to develop a further rapport with the patient.   Clearly substance abuse rehabilitation is recommended with a residential program offering the best treatment. The second level down from a residential treatment program would be an outpatient program such as the intensive outpatient program for substance abuse at [facility].   12-step meetings with a sponsor recommended.   Regarding psychotropic medication other than acute Ativan and Abilify as above, the patient has not provided any evidence of thoughts or mood that would indicate any antidepressant or mood stabilizer regimen.   Regarding the choice of Abilify over Seroquel, Abilify has a lower risk of prolonging the QTc and can also be given in IM form.   As the patient undergoes the supportive care of the hospital, he may provide more input on his current status and his motivation regarding treatment programs.   ____________________________ Adelene Amas. Kinsey Cowsert, MD [id number] D: 08/03/2011 16:58:00 ET T: 08/03/2011 17:27:46 ET JOB#: [id number]  cc: Adelene Amas. Karnisha Lefebre, MD, <Dictator> Lester Highwood MD ELECTRONICALLY SIGNED 08/11/2011 1:46

## 2014-12-20 ENCOUNTER — Emergency Department
Admission: EM | Admit: 2014-12-20 | Discharge: 2014-12-20 | Disposition: A | Discharge status: Home / Self Care | Payer: BC Managed Care – PPO | Attending: Emergency Medicine | Admitting: Emergency Medicine

## 2014-12-20 ENCOUNTER — Encounter: Payer: Self-pay | Admitting: Emergency Medicine

## 2014-12-20 DIAGNOSIS — G8929 Other chronic pain: Secondary | ICD-10-CM | POA: Diagnosis not present

## 2014-12-20 DIAGNOSIS — M545 Low back pain: Secondary | ICD-10-CM | POA: Insufficient documentation

## 2014-12-20 DIAGNOSIS — Z720 Tobacco use: Secondary | ICD-10-CM | POA: Insufficient documentation

## 2014-12-20 DIAGNOSIS — R200 Anesthesia of skin: Secondary | ICD-10-CM | POA: Insufficient documentation

## 2014-12-20 HISTORY — DX: Aneurysm of unspecified site: I72.9

## 2014-12-20 HISTORY — DX: Disorder of kidney and ureter, unspecified: N28.9

## 2014-12-20 HISTORY — DX: Traumatic compartment syndrome of left upper extremity, initial encounter: T79.A12A

## 2014-12-20 HISTORY — DX: Traumatic compartment syndrome of left lower extremity, initial encounter: T79.A22A

## 2014-12-20 LAB — CBC
HCT: 49.1 % (ref 40.0–52.0)
HEMOGLOBIN: 16.3 g/dL (ref 13.0–18.0)
MCH: 29.1 pg (ref 26.0–34.0)
MCHC: 33.1 g/dL (ref 32.0–36.0)
MCV: 87.7 fL (ref 80.0–100.0)
Platelets: 305 10*3/uL (ref 150–440)
RBC: 5.6 MIL/uL (ref 4.40–5.90)
RDW: 16.2 % — AB (ref 11.5–14.5)
WBC: 8.7 10*3/uL (ref 3.8–10.6)

## 2014-12-20 LAB — COMPREHENSIVE METABOLIC PANEL
ALBUMIN: 4.2 g/dL (ref 3.5–5.0)
ALK PHOS: 78 U/L (ref 38–126)
ALT: 36 U/L (ref 17–63)
AST: 36 U/L (ref 15–41)
Anion gap: 7 (ref 5–15)
BILIRUBIN TOTAL: 0.7 mg/dL (ref 0.3–1.2)
BUN: 15 mg/dL (ref 6–20)
CALCIUM: 9.3 mg/dL (ref 8.9–10.3)
CO2: 28 mmol/L (ref 22–32)
Chloride: 105 mmol/L (ref 101–111)
Creatinine, Ser: 0.82 mg/dL (ref 0.61–1.24)
GFR calc Af Amer: >60 mL/min (ref >60)
GFR calc non Af Amer: >60 mL/min (ref >60)
GLUCOSE: 115 mg/dL — AB (ref 65–99)
Potassium: 3.4 mmol/L — ABNORMAL LOW (ref 3.5–5.1)
Sodium: 140 mmol/L (ref 135–145)
TOTAL PROTEIN: 7.8 g/dL (ref 6.5–8.1)

## 2014-12-20 LAB — URINALYSIS COMPLETE WITH MICROSCOPIC (ARMC ONLY)
BILIRUBIN URINE: NEGATIVE
Bacteria, UA: NONE SEEN
GLUCOSE, UA: NEGATIVE mg/dL
Hgb urine dipstick: NEGATIVE
KETONES UR: NEGATIVE mg/dL
LEUKOCYTES UA: NEGATIVE
NITRITE: NEGATIVE
Protein, ur: NEGATIVE mg/dL
SPECIFIC GRAVITY, URINE: 1.016 (ref 1.005–1.030)
pH: 6 (ref 5.0–8.0)

## 2014-12-20 MED ORDER — OXYCODONE HCL 5 MG PO TABS
5.0000 mg | ORAL_TABLET | ORAL | Status: AC
  Administered 2014-12-20: 5 mg via ORAL
  Filled 2014-12-20: qty 1

## 2014-12-20 NOTE — ED Provider Notes (Signed)
[facility] Emergency Department Provider Note REMINDER - THIS NOTE IS NOT A FINAL MEDICAL RECORD UNTIL IT IS SIGNED. UNTIL THEN, THE CONTENT BELOW MAY REFLECT INFORMATION FROM A DOCUMENTATION TEMPLATE, NOT THE ACTUAL PATIENT VISIT. ____________________________________________  Time seen: Approximately 1:52 PM  I have reviewed the triage vital signs and the nursing notes.   HISTORY  Chief Complaint Back Pain    HPI Benjamin Merritt is a 54 y.o. 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 He reports for about the last 3-4 days is been experiencing an aching in his low lumbar back. He reports that he has had chronic back pain in the past, he has fentanyl patches at home but did not per one on today. He reports these come to evaluation today is that he wants to make sure that is not a problem with his kidneys as he has a history of some kidney trouble in the past.  Denies fevers or chills. No numbness or tingling except for some chronic numbness in the left leg. No previous history of any cancer. No weakness in the leg except for his chronic weakness which is unchanged in the left leg.  Days described as aching and worse with moving. Denies abdominal pain. No chest pain or trouble breathing.  Past Medical History  Diagnosis Date  . Compartment syndrome of left lower extremity   . Compartment syndrome of left upper extremity   . Renal disorder   . Aneurysm brain    There are no active problems to display for this patient.   Past Surgical History  Procedure Laterality Date  . Cerebral aneurysm repair    . Arm and leg surgery to corect compartment syndrome      No current outpatient prescriptions on file.  Allergies Review of patient's allergies indicates no known allergies.  History reviewed. No pertinent family history.  Social History Social  History  Substance Use Topics  . Smoking status: Current Every Day Smoker -- 1.00 packs/day    Types: Cigarettes  . Smokeless tobacco: Never Used  . Alcohol Use: No    Review of Systems Constitutional: No fever/chills Eyes: No visual changes. ENT: No sore throat. Cardiovascular: Denies chest pain. Respiratory: Denies shortness of breath. Gastrointestinal: No abdominal pain.  No nausea, no vomiting.  No diarrhea.  No constipation. Genitourinary: Negative for dysuria. Musculoskeletal: See history of present illness Skin: Negative for rash. Neurological: Negative for headaches, focal weakness or numbness. Denies fall or injury. No bowel or bladder changes. No incontinence. 10-point ROS otherwise negative.  ____________________________________________   PHYSICAL EXAM:  VITAL SIGNS: ED Triage Vitals  Enc Vitals Group     BP 12/20/14 1254 111/87 mmHg     Pulse Rate 12/20/14 1254 97     Resp 12/20/14 1254 18     Temp 12/20/14 1254 98.6 F (37 C)     Temp Source 12/20/14 1254 Oral     SpO2 12/20/14 1254 98 %     Weight 12/20/14 1254 145 lb (65.772 kg)     Height 12/20/14 1254 5\' 5"  (1.651 m)     Head Cir --      Peak Flow --      Pain Score 12/20/14 1256 9     Pain Loc --      Pain Edu? --      Excl. in GC? --    Constitutional: Alert and oriented. Well appearing  and in no acute distress. Eyes: Conjunctivae are normal. PERRL. EOMI. Head: Atraumatic. Nose: No congestion/rhinnorhea. Mouth/Throat: Mucous membranes are moist.  Oropharynx non-erythematous. Neck: No stridor.   Cardiovascular: Normal rate, regular rhythm. Grossly normal heart sounds.  Good peripheral circulation. Respiratory: Normal respiratory effort.  No retractions. Lungs CTAB. Gastrointestinal: Soft and nontender. No distention. No abdominal bruits. No CVA tenderness. Musculoskeletal: No lower extremity tenderness nor edema.  No joint effusions. His left forearm and left lower leg are both atrophic in  appearance, patient reports this is chronic and unchanged. His left hand essentially has no motor function, this is normal. He has good motor function of the right upper extremity right lower extremity, and approximately 4 out of 5 strength in the left lower extremity which she reports is normal. Does have some slight numbness over the left foreleg, but states this is chronic. He denies any numbness or weakness in the right side. Neurologic:  Normal speech and language. No gross focal neurologic deficits are appreciated. Left lower leg in a leg brace. Skin:  Skin is warm, dry and intact. No rash noted. Psychiatric: Mood and affect are normal. Speech and behavior are normal.  ____________________________________________   LABS (all labs ordered are listed, but only abnormal results are displayed)  Labs Reviewed  COMPREHENSIVE METABOLIC PANEL - Abnormal; Notable for the following:    Potassium 3.4 (*)    Glucose, Bld 115 (*)    All other components within normal limits  CBC - Abnormal; Notable for the following:    RDW 16.2 (*)    All other components within normal limits  URINALYSIS COMPLETEWITH MICROSCOPIC ([facility] ONLY) - Abnormal; Notable for the following:    Color, Urine YELLOW (*)    APPearance CLEAR (*)    Squamous Epithelial / LPF 0-5 (*)    All other components within normal limits   ____________________________________________  EKG   ____________________________________________  RADIOLOGY  No present indication for emergent imaging. ____________________________________________   PROCEDURES  Procedure(s) performed: None  Critical Care performed: No  ____________________________________________   INITIAL IMPRESSION / ASSESSMENT AND PLAN / ED COURSE  Pertinent labs & imaging results that were available during my care of the patient were reviewed by me and considered in my medical decision making (see chart for details).  Patient presents with increasing low back  pain for last 3-4 days. Reports a history of the same and takes fentanyl patches for this type of chronic discomfort, but states it slightly worse. He denies any systemic symptoms. He does report a history of renal disease and we'll check creatinine, urinalysis to screen for acute renal process. Overall, my impression this is likely slight worsening of his chronic low back pain. The patient is already on final patches at home for similar discomfort, I will give him one oxycodone in the ER as he reports is not places fennel patch today.  ----------------------------------------- 2:57 PM on 12/20/2014 -----------------------------------------  Patient's labs reassuring. Patient states he feels better and would like to go. I will discharge him and reviewed back pain return precautions carefully with him. He is agreeable with the plan. He has a ride picking him up. ____________________________________________   FINAL CLINICAL IMPRESSION(S) / ED DIAGNOSES  Final diagnoses:  Chronic low back pain      Sharyn Creamer, MD 12/20/14 1457

## 2014-12-20 NOTE — Discharge Instructions (Signed)
No driving today.  Please follow up with your doctor as soon as possible regarding today's ED visit and your back pain.  Return to the ED for worsening back pain, fever, weakness or numbness of either leg, or if you develop either (1) an inability to urinate or have bowel movements, or (2) loss of your ability to control your bathroom functions (if you start having "accidents"), or if you develop other new symptoms that concern you.   Back Pain, Adult Low back pain is very common. About 1 in 5 people have back pain.The cause of low back pain is rarely dangerous. The pain often gets better over time.About half of people with a sudden onset of back pain feel better in just 2 weeks. About 8 in 10 people feel better by 6 weeks.  CAUSES Some common causes of back pain include:  Strain of the muscles or ligaments supporting the spine.  Wear and tear (degeneration) of the spinal discs.  Arthritis.  Direct injury to the back. DIAGNOSIS Most of the time, the direct cause of low back pain is not known.However, back pain can be treated effectively even when the exact cause of the pain is unknown.Answering your caregiver's questions about your overall health and symptoms is one of the most accurate ways to make sure the cause of your pain is not dangerous. If your caregiver needs more information, he or she may order lab work or imaging tests (X-rays or MRIs).However, even if imaging tests show changes in your back, this usually does not require surgery. HOME CARE INSTRUCTIONS For many people, back pain returns.Since low back pain is rarely dangerous, it is often a condition that people can learn to [facility] their own.   Remain active. It is stressful on the back to sit or stand in one place. Do not sit, drive, or stand in one place for more than 30 minutes at a time. Take short walks on level surfaces as soon as pain allows.Try to increase the length of time you walk each day.  Do not stay in  bed.Resting more than 1 or 2 days can delay your recovery.  Do not avoid exercise or work.Your body is made to move.It is not dangerous to be active, even though your back may hurt.Your back will likely heal faster if you return to being active before your pain is gone.  Pay attention to your body when you bend and lift. Many people have less discomfortwhen lifting if they bend their knees, keep the load close to their bodies,and avoid twisting. Often, the most comfortable positions are those that put less stress on your recovering back.  Find a comfortable position to sleep. Use a firm mattress and lie on your side with your knees slightly bent. If you lie on your back, put a pillow under your knees.  Only take over-the-counter or prescription medicines as directed by your caregiver. Over-the-counter medicines to reduce pain and inflammation are often the most helpful.Your caregiver may prescribe muscle relaxant drugs.These medicines help dull your pain so you can more quickly return to your normal activities and healthy exercise.  Put ice on the injured area.  Put ice in a plastic bag.  Place a towel between your skin and the bag.  Leave the ice on for 15-20 minutes, 03-04 times a day for the first 2 to 3 days. After that, ice and heat may be alternated to reduce pain and spasms.  Ask your caregiver about trying back exercises and gentle massage.  This may be of some benefit.  Avoid feeling anxious or stressed.Stress increases muscle tension and can worsen back pain.It is important to recognize when you are anxious or stressed and learn ways to manage it.Exercise is a great option. SEEK MEDICAL CARE IF:  You have pain that is not relieved with rest or medicine.  You have pain that does not improve in 1 week.  You have new symptoms.  You are generally not feeling well. SEEK IMMEDIATE MEDICAL CARE IF:   You have pain that radiates from your back into your legs.  You  develop new bowel or bladder control problems.  You have unusual weakness or numbness in your arms or legs.  You develop nausea or vomiting.  You develop abdominal pain.  You feel faint. Document Released: 03/29/2005 Document Revised: 09/28/2011 Document Reviewed: 07/31/2013 [facility] Patient Information 2015 [place], [place]. This information is not intended to replace advice given to you by your health care provider. Make sure you discuss any questions you have with your health care provider.

## 2014-12-20 NOTE — ED Notes (Signed)
Pt states has not slept for three nights and has been experiencing low back pain. States he has a history of compartment syndrome and kidney failure.

## 2014-12-20 NOTE — ED Notes (Signed)
Pt BIB by EMS for complaints of lower back pain x 3 days. Pt reports a history of compartment syndrome in left arm and leg. Pt reports history of renal failure and dialysis.

## 2015-02-09 ENCOUNTER — Emergency Department
Admission: EM | Admit: 2015-02-09 | Discharge: 2015-02-09 | Discharge status: Against Medical Advice | Payer: BC Managed Care – PPO | Attending: Emergency Medicine | Admitting: Emergency Medicine

## 2015-02-09 ENCOUNTER — Emergency Department: Payer: BC Managed Care – PPO

## 2015-02-09 ENCOUNTER — Encounter: Payer: Self-pay | Admitting: Emergency Medicine

## 2015-02-09 DIAGNOSIS — Y9301 Activity, walking, marching and hiking: Secondary | ICD-10-CM | POA: Diagnosis not present

## 2015-02-09 DIAGNOSIS — R509 Fever, unspecified: Secondary | ICD-10-CM | POA: Diagnosis not present

## 2015-02-09 DIAGNOSIS — Y998 Other external cause status: Secondary | ICD-10-CM | POA: Insufficient documentation

## 2015-02-09 DIAGNOSIS — S99912A Unspecified injury of left ankle, initial encounter: Secondary | ICD-10-CM | POA: Diagnosis present

## 2015-02-09 DIAGNOSIS — Y9289 Other specified places as the place of occurrence of the external cause: Secondary | ICD-10-CM | POA: Diagnosis not present

## 2015-02-09 DIAGNOSIS — W1839XA Other fall on same level, initial encounter: Secondary | ICD-10-CM | POA: Diagnosis not present

## 2015-02-09 DIAGNOSIS — Z720 Tobacco use: Secondary | ICD-10-CM | POA: Diagnosis not present

## 2015-02-09 DIAGNOSIS — W19XXXA Unspecified fall, initial encounter: Secondary | ICD-10-CM

## 2015-02-09 DIAGNOSIS — G8220 Paraplegia, unspecified: Secondary | ICD-10-CM | POA: Diagnosis not present

## 2015-02-09 DIAGNOSIS — R531 Weakness: Secondary | ICD-10-CM

## 2015-02-09 LAB — COMPREHENSIVE METABOLIC PANEL
ALBUMIN: 3.7 g/dL (ref 3.5–5.0)
ALT: 27 U/L (ref 17–63)
ANION GAP: 5 (ref 5–15)
AST: 19 U/L (ref 15–41)
Alkaline Phosphatase: 77 U/L (ref 38–126)
BILIRUBIN TOTAL: 0.4 mg/dL (ref 0.3–1.2)
BUN: 18 mg/dL (ref 6–20)
CO2: 30 mmol/L (ref 22–32)
Calcium: 9.1 mg/dL (ref 8.9–10.3)
Chloride: 104 mmol/L (ref 101–111)
Creatinine, Ser: 0.89 mg/dL (ref 0.61–1.24)
GFR calc Af Amer: >60 mL/min (ref >60)
GFR calc non Af Amer: >60 mL/min (ref >60)
GLUCOSE: 107 mg/dL — AB (ref 65–99)
POTASSIUM: 4.1 mmol/L (ref 3.5–5.1)
Sodium: 139 mmol/L (ref 135–145)
TOTAL PROTEIN: 6.8 g/dL (ref 6.5–8.1)

## 2015-02-09 LAB — CBC WITH DIFFERENTIAL/PLATELET
BASOS PCT: 1 %
Basophils Absolute: 0.1 10*3/uL (ref 0–0.1)
EOS ABS: 0.2 10*3/uL (ref 0–0.7)
Eosinophils Relative: 2 %
HEMATOCRIT: 44.6 % (ref 40.0–52.0)
Hemoglobin: 14.8 g/dL (ref 13.0–18.0)
Lymphocytes Relative: 9 %
Lymphs Abs: 0.8 10*3/uL — ABNORMAL LOW (ref 1.0–3.6)
MCH: 29.1 pg (ref 26.0–34.0)
MCHC: 33.2 g/dL (ref 32.0–36.0)
MCV: 87.8 fL (ref 80.0–100.0)
MONO ABS: 0.3 10*3/uL (ref 0.2–1.0)
MONOS PCT: 4 %
NEUTROS ABS: 7.2 10*3/uL — AB (ref 1.4–6.5)
Neutrophils Relative %: 84 %
Platelets: 226 10*3/uL (ref 150–440)
RBC: 5.08 MIL/uL (ref 4.40–5.90)
RDW: 15.4 % — AB (ref 11.5–14.5)
WBC: 8.6 10*3/uL (ref 3.8–10.6)

## 2015-02-09 LAB — TROPONIN I

## 2015-02-09 MED ORDER — SODIUM CHLORIDE 0.9 % IV SOLN
1000.0000 mL | Freq: Once | INTRAVENOUS | Status: AC
  Administered 2015-02-09: 1000 mL via INTRAVENOUS

## 2015-02-09 NOTE — ED Notes (Signed)
Pt walked out of ED. Pt sts that he is waiting for ride and does not want to come back inside.  Pts IV removed by pt.

## 2015-02-09 NOTE — ED Provider Notes (Signed)
[facility] Emergency Department Provider Note     Time seen: ----------------------------------------- 3:15 PM on 02/09/2015 -----------------------------------------    I have reviewed the triage vital signs and the nursing notes.   HISTORY  Chief Complaint Fall    HPI Benjamin Merritt is a 54 y.o. male who presents ER after falling while he was walking to his mailbox. Patient states he's paralyzed left leg from history of compartment syndrome that occurred after he was unable to get up from a previous fall. Patient laid on the floor for an hour, states he has pain in his left ankle is concerning is infection in the left leg.patient is dull pain in left leg, makes better or worse.   Past Medical History  Diagnosis Date  . Compartment syndrome of left lower extremity (HCC)   . Compartment syndrome of left upper extremity (HCC)   . Renal disorder   . Aneurysm (HCC) brain    There are no active problems to display for this patient.   Past Surgical History  Procedure Laterality Date  . Cerebral aneurysm repair    . Arm and leg surgery to corect compartment syndrome      Allergies Review of patient's allergies indicates no known allergies.  Social History Social History  Substance Use Topics  . Smoking status: Current Every Day Smoker -- 1.00 packs/day    Types: Cigarettes  . Smokeless tobacco: Never Used  . Alcohol Use: No    Review of Systems Constitutional: positive for fever Eyes: Negative for visual changes. ENT: Negative for sore throat. Cardiovascular: Negative for chest pain. Respiratory: Negative for shortness of breath. Gastrointestinal: Negative for abdominal pain, vomiting and diarrhea. Genitourinary: Negative for dysuria. Musculoskeletal: left ankle pain Skin: Negative for rash. Neurological: Negative for headaches, increased left leg weakness  10-point ROS otherwise  negative.  ____________________________________________   PHYSICAL EXAM:  VITAL SIGNS: ED Triage Vitals  Enc Vitals Group     BP 02/09/15 1459 112/82 mmHg     Pulse Rate 02/09/15 1459 98     Resp 02/09/15 1459 18     Temp 02/09/15 1459 98.8 F (37.1 C)     Temp Source 02/09/15 1459 Oral     SpO2 02/09/15 1459 98 %     Weight 02/09/15 1500 133 lb (60.328 kg)     Height 02/09/15 1500 5\' 5"  (1.651 m)     Head Cir --      Peak Flow --      Pain Score 02/09/15 1500 9     Pain Loc --      Pain Edu? --      Excl. in GC? --     Constitutional: Alert and oriented. Well appearing and in no distress. Eyes: Conjunctivae are normal. PERRL. Normal extraocular movements. ENT   Head: Normocephalic and atraumatic.   Nose: No congestion/rhinnorhea.   Mouth/Throat: Mucous membranes are moist.   Neck: No stridor. Cardiovascular: Normal rate, regular rhythm. Normal and symmetric distal pulses are present in all extremities. No murmurs, rubs, or gallops. Respiratory: Normal respiratory effort without tachypnea nor retractions. Breath sounds are clear and equal bilaterally. No wheezes/rales/rhonchi. Gastrointestinal: Soft and nontender. No distention. No abdominal bruits.  Musculoskeletal: left upper and left lower extremity are atrophic, patient has markedly lower extremity strength compared to right. Decreased sensation left lower extremity as well. Neurologic:  Normal speech and language. No gross focal neurologic deficits are appreciated. Speech is normal. No gait instability. Skin:  Skin is warm, dry  and intact. No rash noted. Psychiatric: Mood and affect are normal. Speech and behavior are normal. Patient exhibits appropriate insight and judgment.  ____________________________________________  ED COURSE:  Pertinent labs & imaging results that were available during my care of the patient were reviewed by me and considered in my medical decision making (see chart for  details). Unclear etiology for acute on chronic weakness. Will check labs and brain CT. ____________________________________________    LABS (pertinent positives/negatives)  Labs Reviewed  CBC WITH DIFFERENTIAL/PLATELET - Abnormal; Notable for the following:    RDW 15.4 (*)    Neutro Abs 7.2 (*)    Lymphs Abs 0.8 (*)    All other components within normal limits  COMPREHENSIVE METABOLIC PANEL - Abnormal; Notable for the following:    Glucose, Bld 107 (*)    All other components within normal limits  TROPONIN I  URINALYSIS COMPLETEWITH MICROSCOPIC ([facility] ONLY)    RADIOLOGY Images were viewed by me  CT head, left ankle IMPRESSION: 1. No acute intracranial findings. 2. Suprasellar aneurysm coil. Marland Kitchen IMPRESSION: 1. No fracture or dislocation. 2. Soft tissue swelling over the medial malleolus.  ____________________________________________  FINAL ASSESSMENT AND PLAN  Fall, left-sided weakness  Plan: Patient with labs and imaging as dictated above. Patient walked out of the ER and refused further treatment on the leg that he states was paralyzed. He left before treatment was complete and before I could give him all of his results.   Emily Filbert, MD   Emily Filbert, MD 02/09/15 [phone]

## 2015-02-09 NOTE — ED Notes (Signed)
Pt to ED with c/o of fall while walking to mailbox, pt states he is paralyzed to left leg from hx of compartment syndrome, states he was unable to get up after the fall and laid in the floor for about an hour, pt states he has pain to left ankle from wound, states wound has drainage present but he is not currently being treated for it, denies any injury from fall

## 2015-02-09 NOTE — ED Notes (Signed)
Patient transported to XR. 

## 2015-05-14 DEATH — deceased

## 2016-05-08 IMAGING — CR DG ANKLE COMPLETE 3+V*L*
3 series · 3 of 3 positions shown · non-contrast
Comparison: None.

CLINICAL DATA: Abrasions, redness, swelling of the LEFT ankle

EXAM:
LEFT ANKLE COMPLETE - 3+ VIEW

[ankle ap]
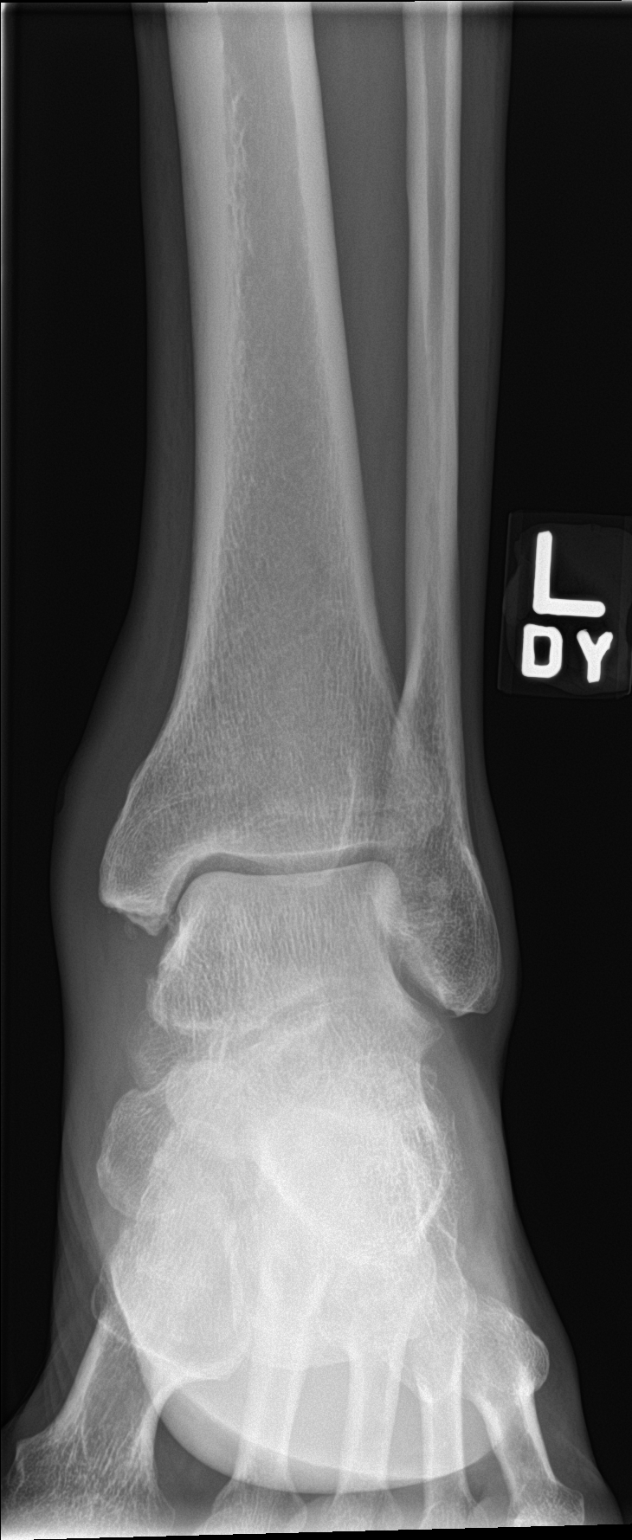

[ankle obl]
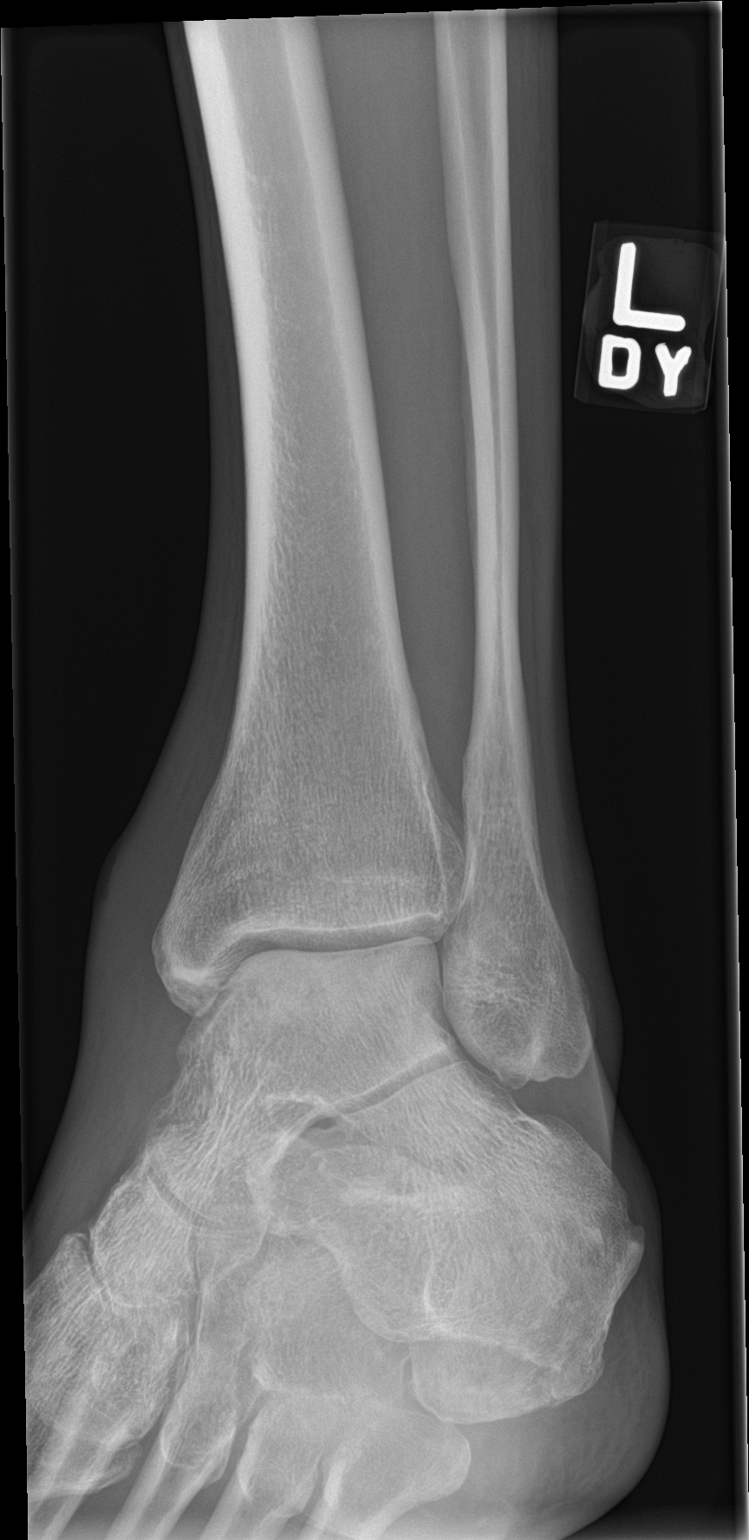

[ankle lat]
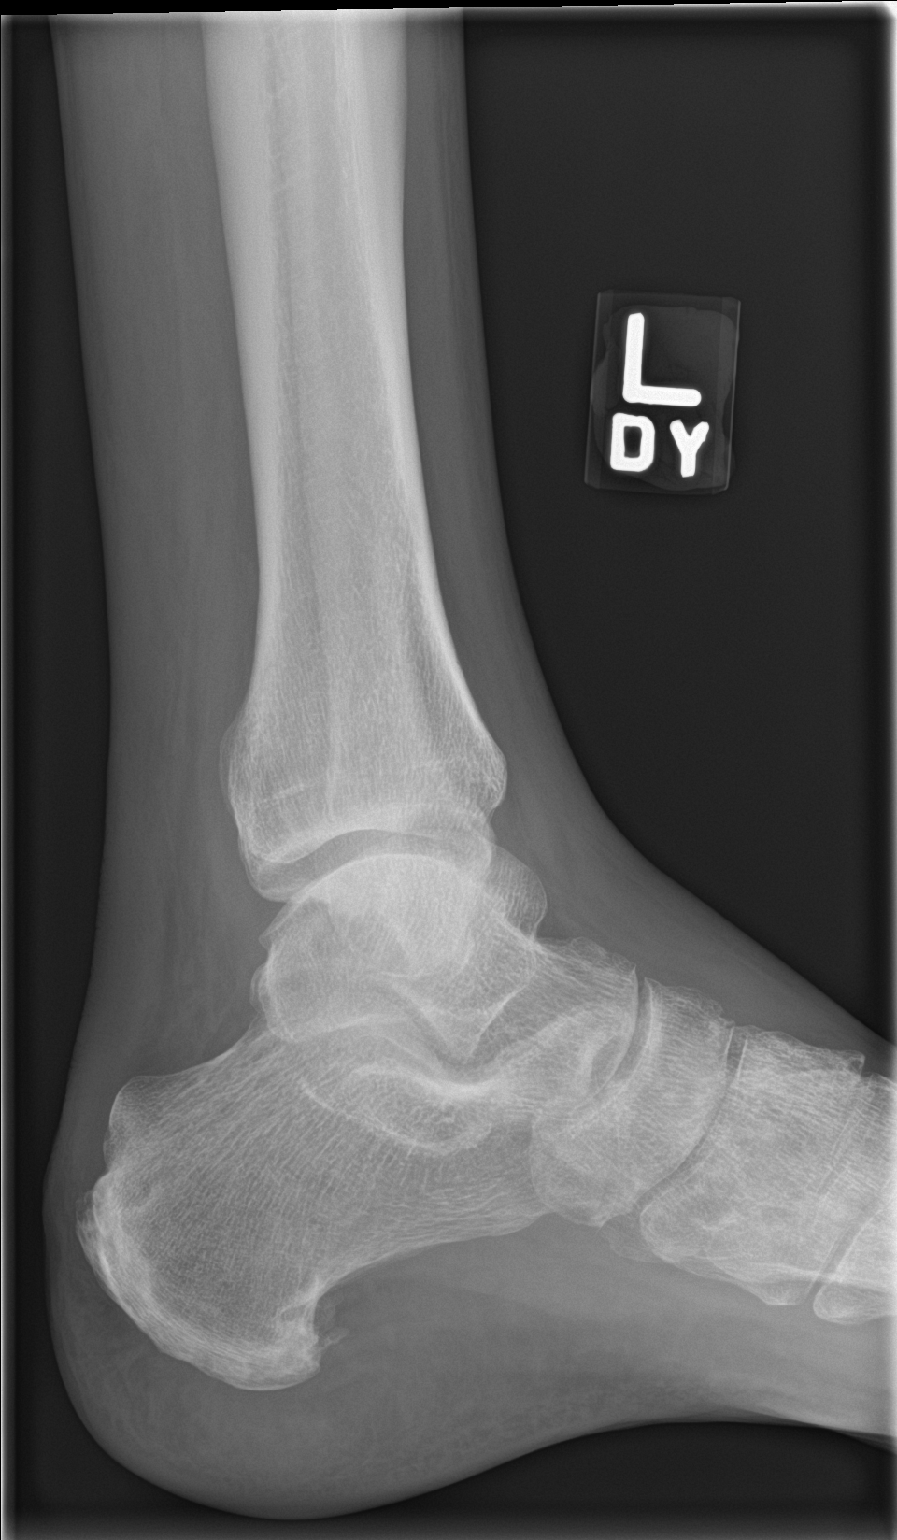

[3 of 3 positions shown; findings below may reference images not displayed]

FINDINGS: Soft tissue swelling over the medial malleolus. Ankle mortise
intact. The talar dome is normal. No malleolar fracture. The
calcaneus is normal.
IMPRESSION: 1.  No fracture or dislocation.
2. Soft tissue swelling over the medial malleolus.

## 2016-05-08 IMAGING — CT CT HEAD W/O CM
1 series · 16 of 30 positions shown, 20 images · non-contrast
Comparison: Head CT 03/21/2011

CLINICAL DATA: Fall.  Fall walking at home.

EXAM:
CT HEAD WITHOUT CONTRAST
TECHNIQUE: Contiguous axial images were obtained from the base of the skull
through the vertex without intravenous contrast.

[Series 2: head wo · axial · 0.46mm/px · z∈[+298,+437]mm · 16 of 34 slices shown, 20 images]
[im 2/34  brain]
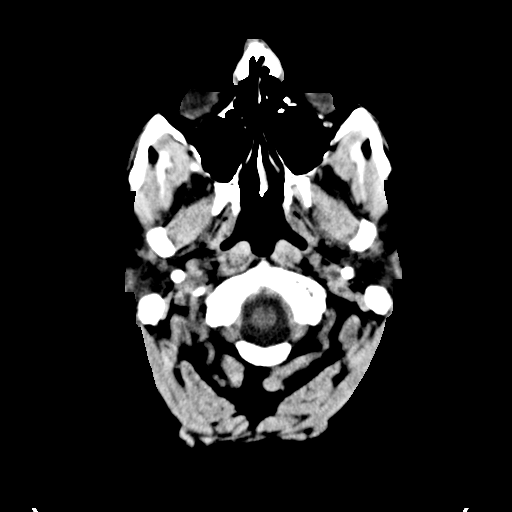
[im 2/34  bone]
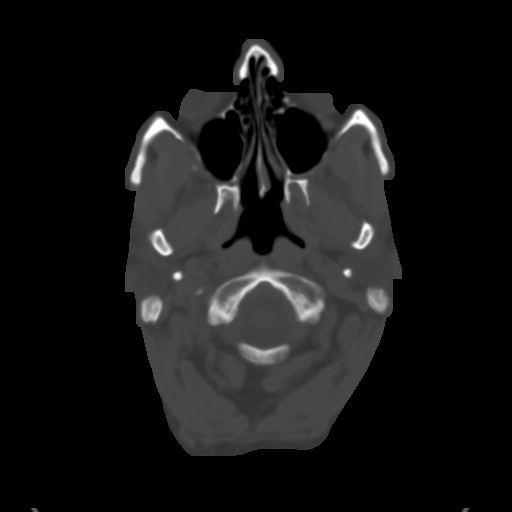
[im 4/34  brain]
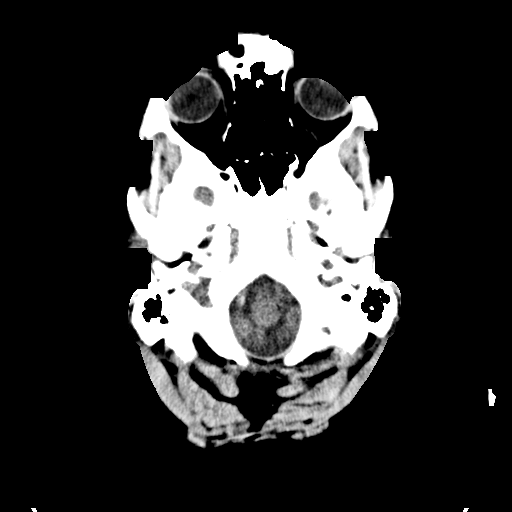
[im 6/34  brain]
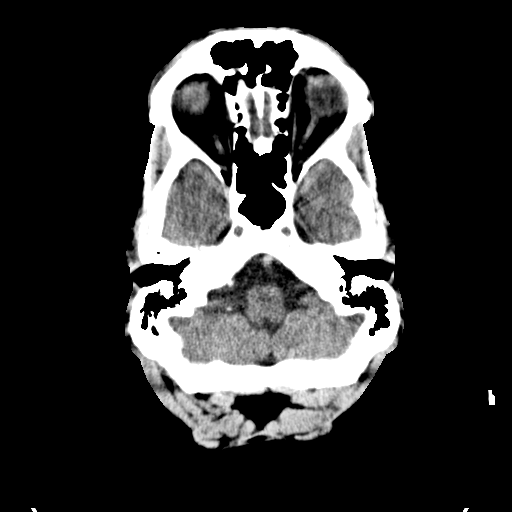
[im 8/34  brain]
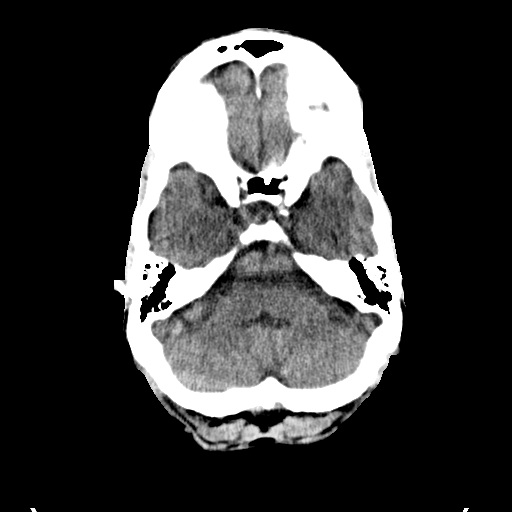
[im 10/34  brain]
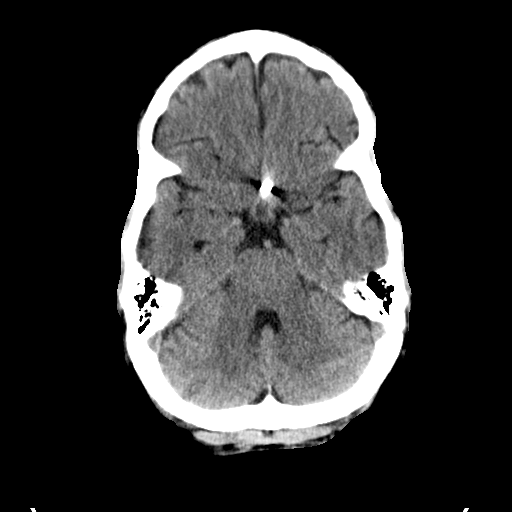
[im 10/34  bone]
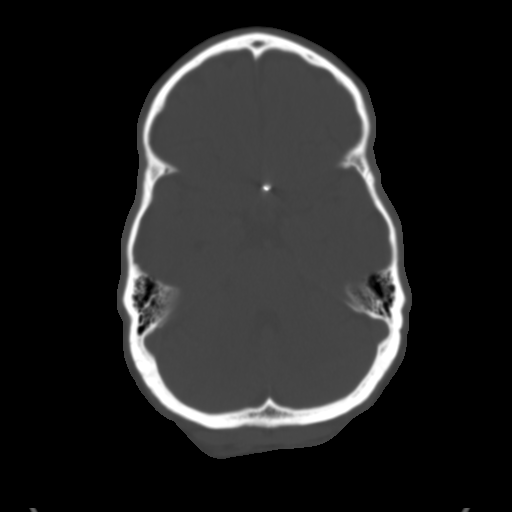
[im 12/34  brain]
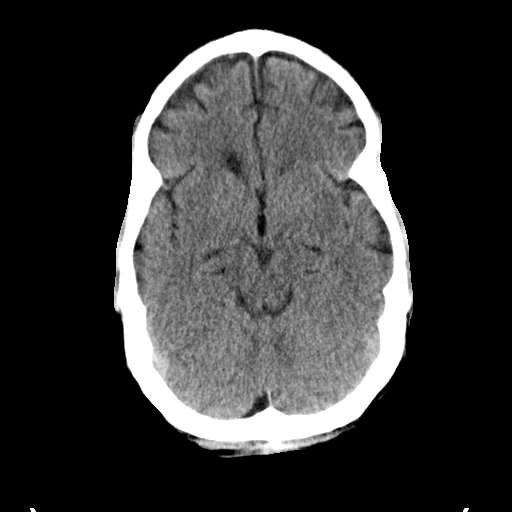
[im 14/34  brain]
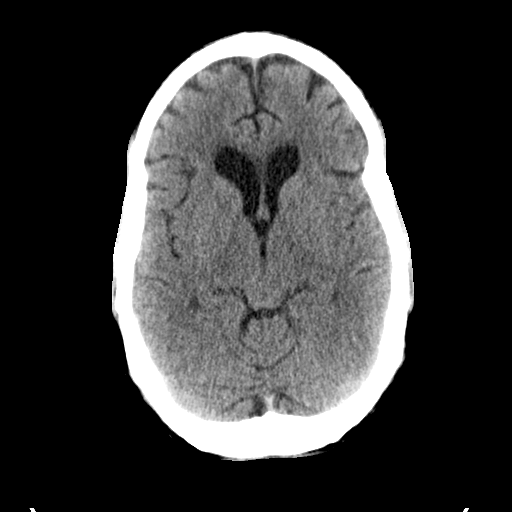
[im 16/34  brain]
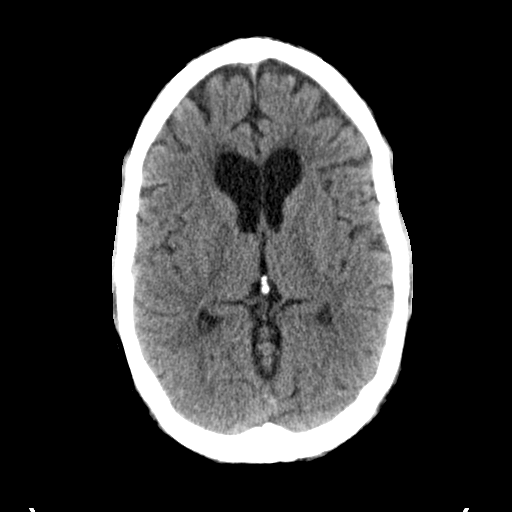
[im 18/34  brain]
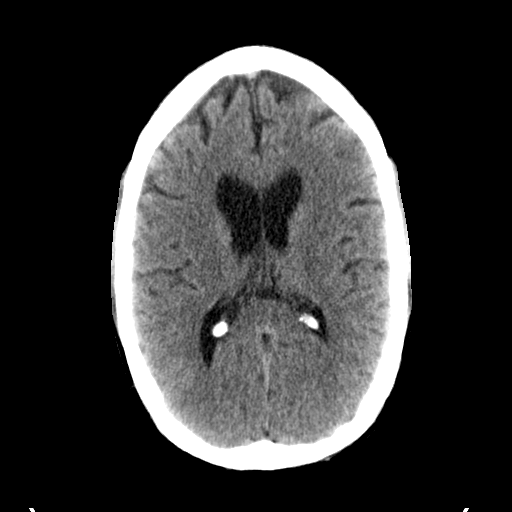
[im 18/34  bone]
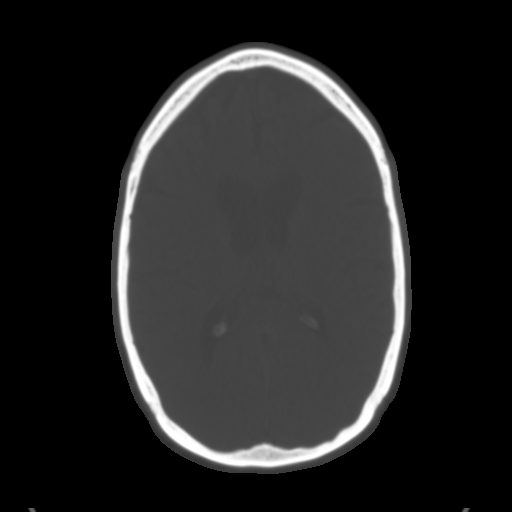
[im 20/34  brain]
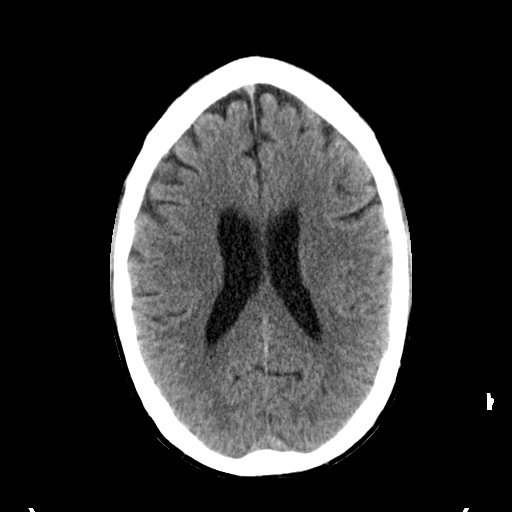
[im 22/34  brain]
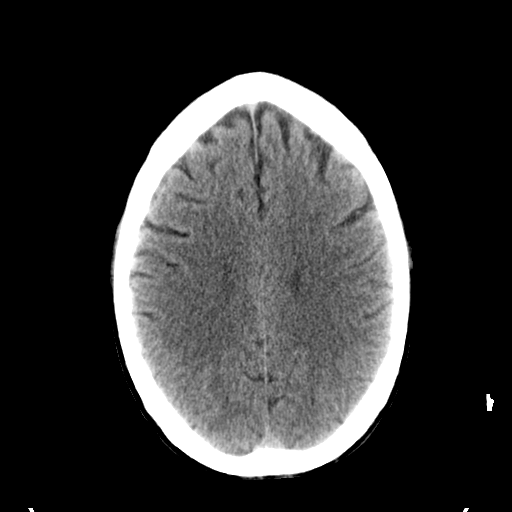
[im 24/34  brain]
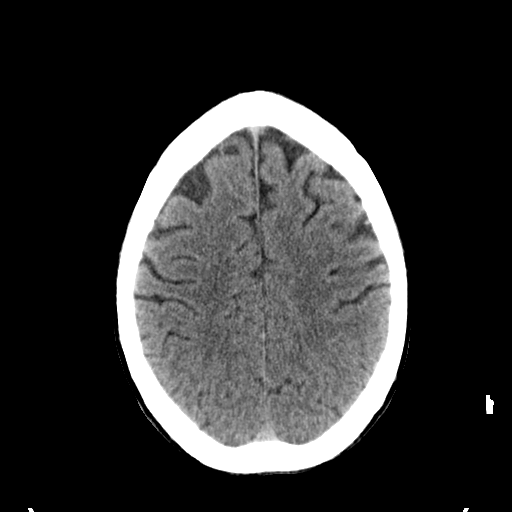
[im 26/34  brain]
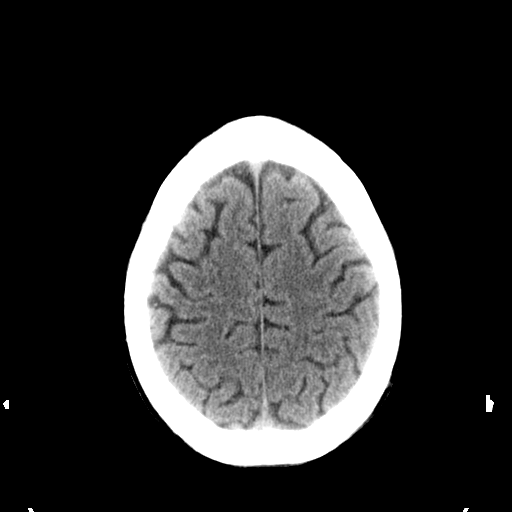
[im 26/34  bone]
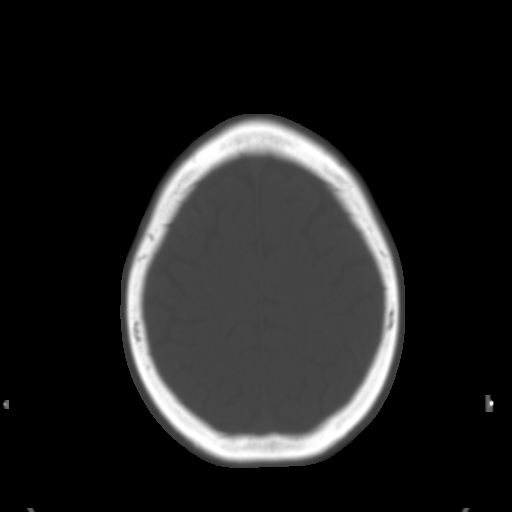
[im 28/34  brain]
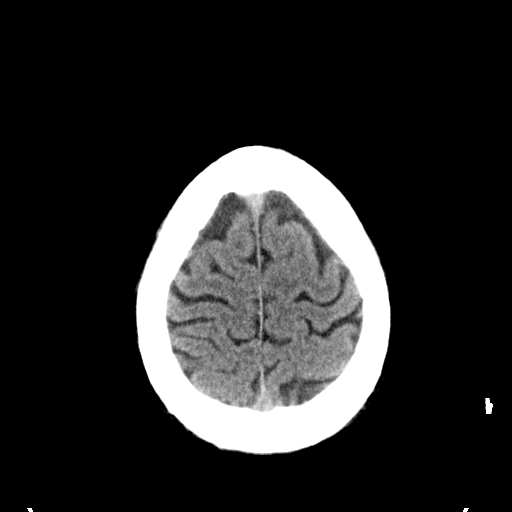
[im 30/34  brain]
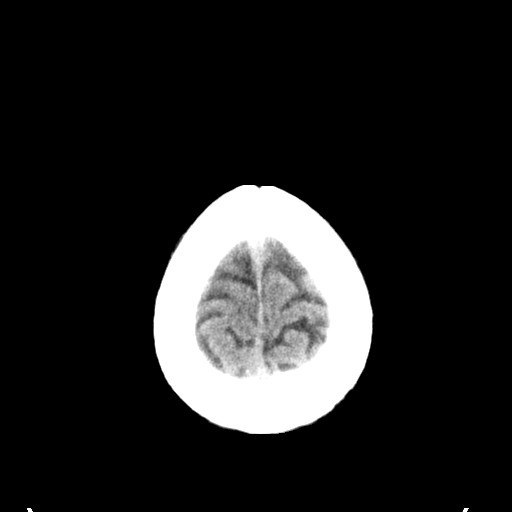
[im 32/34  brain]
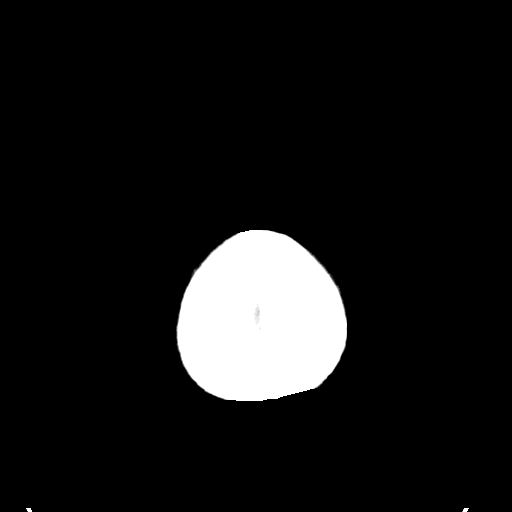

[16 of 30 positions shown; findings below may reference images not displayed]

FINDINGS: Aneurysm "L superior to the sella.

No acute intracranial hemorrhage. No focal mass lesion. No CT
evidence of acute infarction. No midline shift or mass effect. No
hydrocephalus. Basilar cisterns are patent.

Paranasal sinuses and  mastoid air cells are clear.
IMPRESSION: 1. No acute intracranial findings.
2. Suprasellar aneurysm coil.  .
